# Patient Record
Sex: Female | Born: 1995 | Race: Black or African American | Hispanic: No | Marital: Married | Smoking: Former smoker
Health system: Southern US, Community
[De-identification: ages and names within clinical notes are randomized; demographics above are authoritative.]

## PROBLEM LIST (undated history)

## (undated) ENCOUNTER — Inpatient Hospital Stay (HOSPITAL_COMMUNITY): Payer: Self-pay

## (undated) DIAGNOSIS — Z3480 Encounter for supervision of other normal pregnancy, unspecified trimester: Secondary | ICD-10-CM

## (undated) DIAGNOSIS — F319 Bipolar disorder, unspecified: Secondary | ICD-10-CM

## (undated) DIAGNOSIS — Z8619 Personal history of other infectious and parasitic diseases: Secondary | ICD-10-CM

## (undated) DIAGNOSIS — B999 Unspecified infectious disease: Secondary | ICD-10-CM

## (undated) DIAGNOSIS — F419 Anxiety disorder, unspecified: Secondary | ICD-10-CM

## (undated) DIAGNOSIS — Z3483 Encounter for supervision of other normal pregnancy, third trimester: Secondary | ICD-10-CM

## (undated) DIAGNOSIS — F329 Major depressive disorder, single episode, unspecified: Secondary | ICD-10-CM

## (undated) DIAGNOSIS — N390 Urinary tract infection, site not specified: Secondary | ICD-10-CM

## (undated) DIAGNOSIS — J45909 Unspecified asthma, uncomplicated: Secondary | ICD-10-CM

## (undated) DIAGNOSIS — F32A Depression, unspecified: Secondary | ICD-10-CM

## (undated) DIAGNOSIS — F909 Attention-deficit hyperactivity disorder, unspecified type: Secondary | ICD-10-CM

## (undated) HISTORY — DX: Bipolar disorder, unspecified: F31.9

## (undated) HISTORY — PX: NO PAST SURGERIES: SHX2092

## (undated) HISTORY — DX: Personal history of other infectious and parasitic diseases: Z86.19

## (undated) HISTORY — DX: Attention-deficit hyperactivity disorder, unspecified type: F90.9

## (undated) HISTORY — PX: WISDOM TOOTH EXTRACTION: SHX21

---
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
## 2013-03-31 ENCOUNTER — Encounter (HOSPITAL_COMMUNITY): Payer: Self-pay

## 2013-03-31 ENCOUNTER — Inpatient Hospital Stay (HOSPITAL_COMMUNITY)
Admission: AD | Admit: 2013-03-31 | Discharge: 2013-03-31 | Disposition: A | Discharge status: Home / Self Care | Payer: Medicaid Other | Source: Ambulatory Visit | Attending: Obstetrics and Gynecology | Admitting: Obstetrics and Gynecology

## 2013-03-31 DIAGNOSIS — R05 Cough: Secondary | ICD-10-CM | POA: Insufficient documentation

## 2013-03-31 DIAGNOSIS — H9209 Otalgia, unspecified ear: Secondary | ICD-10-CM | POA: Insufficient documentation

## 2013-03-31 DIAGNOSIS — R059 Cough, unspecified: Secondary | ICD-10-CM | POA: Insufficient documentation

## 2013-03-31 DIAGNOSIS — O99891 Other specified diseases and conditions complicating pregnancy: Secondary | ICD-10-CM | POA: Insufficient documentation

## 2013-03-31 DIAGNOSIS — J069 Acute upper respiratory infection, unspecified: Secondary | ICD-10-CM

## 2013-03-31 DIAGNOSIS — R109 Unspecified abdominal pain: Secondary | ICD-10-CM | POA: Insufficient documentation

## 2013-03-31 DIAGNOSIS — J029 Acute pharyngitis, unspecified: Secondary | ICD-10-CM | POA: Insufficient documentation

## 2013-03-31 HISTORY — DX: Urinary tract infection, site not specified: N39.0

## 2013-03-31 HISTORY — DX: Unspecified asthma, uncomplicated: J45.909

## 2013-03-31 HISTORY — DX: Unspecified infectious disease: B99.9

## 2013-03-31 LAB — URINALYSIS, ROUTINE W REFLEX MICROSCOPIC
Bilirubin Urine: NEGATIVE
Glucose, UA: NEGATIVE mg/dL
Hgb urine dipstick: NEGATIVE
Specific Gravity, Urine: 1.02 (ref 1.005–1.030)
Urobilinogen, UA: 2 mg/dL — ABNORMAL HIGH (ref 0.0–1.0)

## 2013-03-31 LAB — URINE MICROSCOPIC-ADD ON

## 2013-03-31 NOTE — MAU Note (Signed)
Patient presents to MAU with c/o sore throat since Monday, progressing to chest congestion and productive cough. Reports green sputum since last night. Denies fever, chills.  Hx of asthma; denies inhaler use at present time.  Denies vaginal bleeding, LOF or cramping.

## 2013-03-31 NOTE — MAU Provider Note (Signed)
History     CSN: [id number]  Arrival date and time: 03/31/13 4098   First Provider Initiated Contact with Patient 03/31/13 [phone]      Chief Complaint  Patient presents with  . URI  . Cough  . Abdominal Pain   HPI Mikayla [name] is a 17 y.o. G2P1001 at [redacted]w[redacted]d who presents to MAU today with sore throat, cough, congestion and ear pain since Monday. The patient states that she has productive cough, with green sputum. She is having nasal congestion. She denies fever, N/V, abdominal pain, vaginal bleeding or discharge. She reports good fetal movement. She has not tried anything OTC for her symptoms.   OB History   Grav Para Term Preterm Abortions TAB SAB Ect Mult Living   1               Past Medical History  Diagnosis Date  . Infection   . UTI (lower urinary tract infection)     No past surgical history on file.  No family history on file.  History  Substance Use Topics  . Smoking status: Not on file  . Smokeless tobacco: Not on file  . Alcohol Use: Not on file    Allergies:  Allergies  Allergen Reactions  . Codeine Swelling  . Penicillins Swelling    Prescriptions prior to admission  Medication Sig Dispense Refill  . diphenhydrAMINE (BENADRYL) 25 MG tablet Take 50 mg by mouth every 6 (six) hours as needed for itching or allergies.      . Prenatal Vit-Fe Fumarate-FA (PRENATAL MULTIVITAMIN) TABS Take 1 tablet by mouth daily at 12 noon.        Review of Systems  Constitutional: Negative for fever, chills and malaise/fatigue.  HENT: Positive for ear pain, congestion and sore throat.   Respiratory: Positive for cough and sputum production.   Gastrointestinal: Positive for nausea and vomiting. Negative for abdominal pain, diarrhea and constipation.  Genitourinary: Negative for dysuria, urgency and frequency.       Neg - vaginal bleeding, discharge, LOF   Physical Exam   Blood pressure 117/69, pulse 92, temperature 97.9 F (36.6 C), temperature source Oral,  height 5\' 5"  (1.651 m), weight 147 lb 12.8 oz (67.042 kg), last menstrual period 11/16/2012, SpO2 100.00%.  Physical Exam  Constitutional: She is oriented to person, place, and time. She appears well-developed and well-nourished. No distress.  HENT:  Head: Normocephalic and atraumatic.  Right Ear: No drainage or swelling. Tympanic membrane is not erythematous and not bulging.  Left Ear: No drainage or swelling. Tympanic membrane is not erythematous and not bulging.  Nose: Mucosal edema and rhinorrhea present. No sinus tenderness. No epistaxis. Right sinus exhibits no maxillary sinus tenderness and no frontal sinus tenderness. Left sinus exhibits no maxillary sinus tenderness and no frontal sinus tenderness.  Mouth/Throat: Posterior oropharyngeal erythema present. No oropharyngeal exudate, posterior oropharyngeal edema or tonsillar abscesses.  Cardiovascular: Normal rate, regular rhythm and normal heart sounds.   Respiratory: Effort normal and breath sounds normal. No respiratory distress. She has no wheezes.  GI: Soft. She exhibits no distension and no mass. There is no tenderness. There is no rebound and no guarding.  Neurological: She is alert and oriented to person, place, and time.  Skin: Skin is warm and dry. No erythema.  Psychiatric: She has a normal mood and affect.    MAU Course  Procedures None  MDM Discussed patient with Dr. Senaida Ores. Patient should take approved OTC cold medications and call the office  for follow-up if symptoms don't improve in ~1 week or she develops fever  Assessment and Plan  A: Acute viral URI  P: Discharge home Patient encouraged to take OTC cold medications approved in pregnancy. Handout given Patient should follow-up in office as scheduled or sooner if she develops fever or symptoms fail to improve Patient may return to MAU as needed  Freddi Starr, PA-C  03/31/2013, 9:44 AM

## 2013-03-31 NOTE — MAU Note (Signed)
Patient states that at the beginning of this week she began having cold like symptoms, sore throat, cough and ear pain. Has gotten worse over the week and had chest tightness and coughed up green mucus. Has a little abdominal cramping, no bleeding or discharge.

## 2013-04-11 LAB — OB RESULTS CONSOLE ABO/RH: RH Type: POSITIVE

## 2013-04-11 LAB — OB RESULTS CONSOLE ANTIBODY SCREEN: Antibody Screen: NEGATIVE

## 2013-04-11 LAB — OB RESULTS CONSOLE GC/CHLAMYDIA: Chlamydia: NEGATIVE

## 2013-04-11 LAB — OB RESULTS CONSOLE HEPATITIS B SURFACE ANTIGEN: Hepatitis B Surface Ag: NEGATIVE

## 2013-06-30 ENCOUNTER — Encounter (HOSPITAL_COMMUNITY): Payer: Self-pay | Admitting: *Deleted

## 2013-06-30 ENCOUNTER — Inpatient Hospital Stay (HOSPITAL_COMMUNITY)
Admission: AD | Admit: 2013-06-30 | Discharge: 2013-07-01 | Disposition: A | Discharge status: Home / Self Care | Payer: Medicaid Other | Source: Ambulatory Visit | Attending: Obstetrics and Gynecology | Admitting: Obstetrics and Gynecology

## 2013-06-30 DIAGNOSIS — M545 Low back pain, unspecified: Secondary | ICD-10-CM | POA: Insufficient documentation

## 2013-06-30 DIAGNOSIS — O4703 False labor before 37 completed weeks of gestation, third trimester: Secondary | ICD-10-CM

## 2013-06-30 DIAGNOSIS — O4700 False labor before 37 completed weeks of gestation, unspecified trimester: Secondary | ICD-10-CM | POA: Insufficient documentation

## 2013-06-30 LAB — URINALYSIS, ROUTINE W REFLEX MICROSCOPIC
Glucose, UA: NEGATIVE mg/dL
Ketones, ur: NEGATIVE mg/dL
Nitrite: NEGATIVE
Protein, ur: NEGATIVE mg/dL
Urobilinogen, UA: 0.2 mg/dL (ref 0.0–1.0)

## 2013-06-30 LAB — URINE MICROSCOPIC-ADD ON

## 2013-06-30 NOTE — MAU Note (Signed)
I've been having contractions for over an hour. Having pain in lower back and butt. No bleeding. Leaking alittle watery fld and white d/c

## 2013-06-30 NOTE — MAU Note (Addendum)
PT SAYS SHE STARTED HURTING AT 6PM-  HAVING UC'S.  -  WHILE SHOPPING AT [facility].    NO VE IN OFFICE.  LAST SEX-   AT 5PM.   PT HAS BEEN DX  WITH BIPOLAR, DEPRESSION, ANXIETY, ADHD-  NO MEDS AND NO THERAPY

## 2013-06-30 NOTE — MAU Provider Note (Signed)
Chief Complaint:  Contractions   First Provider Initiated Contact with Patient 06/30/13 2349      HPI: Mikayla [name] is a 17 y.o. G2P1001 at [redacted]w[redacted]d who presents to maternity admissions reporting contractions and back pain with onset today after intercourse.  She reports good fetal movement, denies LOF, vaginal bleeding, vaginal itching/burning, urinary symptoms, h/a, dizziness, n/v, or fever/chills.    Past Medical History: Past Medical History  Diagnosis Date  . Infection   . UTI (lower urinary tract infection)   . Asthma     Past obstetric history: OB History  Gravida Para Term Preterm AB SAB TAB Ectopic Multiple Living  2 1 1       1     # Outcome Date GA Lbr Len/2nd Weight Sex Delivery Anes PTL Lv  2 CUR           1 TRM  [redacted]w[redacted]d   F SVD EPI N Y      Past Surgical History: Past Surgical History  Procedure Laterality Date  . No past surgeries      Family History: History reviewed. No pertinent family history.  Social History: History  Substance Use Topics  . Smoking status: Former Smoker    Quit date: 10/31/2012  . Smokeless tobacco: Never Used  . Alcohol Use: No    Allergies:  Allergies  Allergen Reactions  . Codeine Swelling  . Penicillins Swelling    Meds:  Prescriptions prior to admission  Medication Sig Dispense Refill  . diphenhydrAMINE (BENADRYL) 25 MG tablet Take 50 mg by mouth every 6 (six) hours as needed for itching or allergies.      . Prenatal Vit-Fe Fumarate-FA (PRENATAL MULTIVITAMIN) TABS Take 1 tablet by mouth daily at 12 noon.        ROS: Pertinent findings in history of present illness.  Physical Exam  Blood pressure 135/71, pulse 88, temperature 98.1 F (36.7 C), resp. rate 18, height 5\' 4"  (1.626 m), weight 78.382 kg (172 lb 12.8 oz), last menstrual period 11/16/2012. GENERAL: Well-developed, well-nourished female in no acute distress.  HEENT: normocephalic HEART: normal rate RESP: normal effort ABDOMEN: Soft, non-tender,  gravid appropriate for gestational age EXTREMITIES: Nontender, no edema NEURO: alert and oriented   Dilation: Closed Effacement (%): 30 Cervical Position: Posterior Exam by:: Arman Loy, CNM External os 2 cm, cervix firm, posterior  FHT:  Baseline 140, moderate variability, accelerations present, no decelerations Contractions: occasional, lasting ~40 seconds   Labs: Results for orders placed during the hospital encounter of 06/30/13 (from the past 24 hour(s))  URINALYSIS, ROUTINE W REFLEX MICROSCOPIC     Status: Abnormal   Collection Time    06/30/13 11:05 PM      Result Value Range   Color, Urine YELLOW  YELLOW   APPearance CLEAR  CLEAR   Specific Gravity, Urine >1.030 (*) 1.005 - 1.030   pH 6.5  5.0 - 8.0   Glucose, UA NEGATIVE  NEGATIVE mg/dL   Hgb urine dipstick NEGATIVE  NEGATIVE   Bilirubin Urine NEGATIVE  NEGATIVE   Ketones, ur NEGATIVE  NEGATIVE mg/dL   Protein, ur NEGATIVE  NEGATIVE mg/dL   Urobilinogen, UA 0.2  0.0 - 1.0 mg/dL   Nitrite NEGATIVE  NEGATIVE   Leukocytes, UA SMALL (*) NEGATIVE  URINE MICROSCOPIC-ADD ON     Status: Abnormal   Collection Time    06/30/13 11:05 PM      Result Value Range   Squamous Epithelial / LPF FEW (*) RARE   WBC, UA  3-6  <3 WBC/hpf   RBC / HPF 0-2  <3 RBC/hpf   Bacteria, UA FEW (*) RARE    Assessment: 1. Preterm contractions, third trimester     Plan: Consult with Dr Ellyn Hack Discharge home PTL precautions and fetal kick counts Increase PO fluid F/U in office Return to MAU as needed    Medication List    ASK your doctor about these medications       diphenhydrAMINE 25 MG tablet  Commonly known as:  BENADRYL  Take 50 mg by mouth every 6 (six) hours as needed for itching or allergies.     prenatal multivitamin Tabs tablet  Take 1 tablet by mouth daily at 12 noon.        Sharen Counter Certified Nurse-Midwife 06/30/2013 11:56 PM

## 2013-07-01 DIAGNOSIS — O479 False labor, unspecified: Secondary | ICD-10-CM

## 2013-07-10 LAB — URINE CULTURE: Culture: NO GROWTH

## 2013-07-20 LAB — OB RESULTS CONSOLE GBS: GBS: NEGATIVE

## 2013-08-18 ENCOUNTER — Encounter (HOSPITAL_COMMUNITY): Payer: Self-pay

## 2013-08-18 DIAGNOSIS — Z3480 Encounter for supervision of other normal pregnancy, unspecified trimester: Secondary | ICD-10-CM

## 2013-08-18 HISTORY — DX: Encounter for supervision of other normal pregnancy, unspecified trimester: Z34.80

## 2013-08-18 NOTE — H&P (Addendum)
Mikayla [name] is a 17 y.o. female G2P1001 at 39+ for IOL, term, favorable cervix.  Dated by LMP cw [place] - late entry to care 18 wk.  +FM, no LOF, no VB, occ ctx.  Relatively uncomplicated PNC except ?scoliosis, ADHD, bipolar/depression - well-controlled.  Also h/o asthma and seasonal allergies.    Maternal Medical History:  Contractions: Frequency: irregular.   Perceived severity is moderate.    Fetal activity: Perceived fetal activity is normal.    Prenatal complications: no prenatal complications Prenatal Complications - Diabetes: none.    OB History   Grav Para Term Preterm Abortions TAB SAB Ect Mult Living   2 1 1       1     G1 09/20/10 TSVD 7#15 female G2 present No pap, no STDs Past Medical History  Diagnosis Date  . Infection   . UTI (lower urinary tract infection)   . Asthma   . Normal pregnancy, repeat 08/18/2013   Past Surgical History  Procedure Laterality Date  . No past surgeries     Family History: family history is not on file. Social History:  reports that she quit smoking about 9 months ago. She has never used smokeless tobacco. She reports that she does not drink alcohol or use illicit drugs. Meds PNV All: Codeine, PCN   Prenatal Transfer Tool  Maternal Diabetes: No Genetic Screening: Normal Maternal Ultrasounds/Referrals: Normal Fetal Ultrasounds or other Referrals:  None Maternal Substance Abuse:  No Significant Maternal Medications:  None Significant Maternal Lab Results:  Lab values include: Group B Strep negative Other Comments:  Mom has dx of bipolar/depression - well controlled, no meds  Review of Systems  Constitutional: Negative.   HENT: Negative.   Eyes: Negative.   Respiratory: Negative.   Cardiovascular: Negative.   Gastrointestinal: Negative.   Genitourinary: Negative.   Musculoskeletal: Negative.   Skin: Negative.   Neurological: Negative.   Psychiatric/Behavioral: Negative.       Last menstrual period  11/16/2012. Maternal Exam:  Uterine Assessment: Contraction strength is mild.  Contraction frequency is irregular.   Abdomen: Fundal height is appropriate for gestation.   Estimated fetal weight is 7-8#.   Fetal presentation: vertex  Introitus: Normal vulva. Normal vagina.  Pelvis: adequate for delivery.   Cervix: Cervix evaluated by digital exam.     Physical Exam  Constitutional: She is oriented to person, place, and time. She appears well-developed and well-nourished.  HENT:  Head: Normocephalic and atraumatic.  Cardiovascular: Normal rate and regular rhythm.   Respiratory: Effort normal and breath sounds normal. No respiratory distress. She has no wheezes.  GI: Soft. Bowel sounds are normal. She exhibits no distension. There is no tenderness.  Musculoskeletal: Normal range of motion.  Neurological: She is alert and oriented to person, place, and time.  Skin: Skin is warm and dry.  Psychiatric: She has a normal mood and affect. Her behavior is normal.    Prenatal labs: ABO, Rh:  A+ Antibody:  neg Rubella:  immune RPR:   NR HBsAg:   neg HIV:   neg GBS:   neg  Hgb 11.2/Plt 243K/ UrCx neg/ hgb electro WNL/ CF neg/ AFP WNL/ glucola 73  Tdap 05/23/13 Flu 07/20/13  [place] EDC 08/23/13, nl anat, post plac, female  Assessment/Plan: 17yo G2P1001 at 39+ for IOL gbbs negative IOL with AROM/ pitocin Expect SVD   [name],Jameis Newsham 08/18/2013, 9:05 PM

## 2013-08-19 ENCOUNTER — Inpatient Hospital Stay (HOSPITAL_COMMUNITY)
Admission: RE | Admit: 2013-08-19 | Discharge: 2013-08-21 | DRG: 775 | Disposition: A | Discharge status: Home / Self Care | Payer: Medicaid Other | Source: Ambulatory Visit | Attending: Obstetrics and Gynecology | Admitting: Obstetrics and Gynecology

## 2013-08-19 ENCOUNTER — Encounter (HOSPITAL_COMMUNITY): Payer: Self-pay

## 2013-08-19 ENCOUNTER — Encounter (HOSPITAL_COMMUNITY): Payer: Medicaid Other | Admitting: Anesthesiology

## 2013-08-19 ENCOUNTER — Inpatient Hospital Stay (HOSPITAL_COMMUNITY): Payer: Medicaid Other | Admitting: Anesthesiology

## 2013-08-19 VITALS — BP 104/51 | HR 70 | Temp 98.5°F | Resp 18 | Ht 64.0 in | Wt 177.4 lb

## 2013-08-19 DIAGNOSIS — Z885 Allergy status to narcotic agent status: Secondary | ICD-10-CM

## 2013-08-19 DIAGNOSIS — F909 Attention-deficit hyperactivity disorder, unspecified type: Secondary | ICD-10-CM | POA: Diagnosis present

## 2013-08-19 DIAGNOSIS — O99344 Other mental disorders complicating childbirth: Secondary | ICD-10-CM | POA: Diagnosis present

## 2013-08-19 DIAGNOSIS — Z3480 Encounter for supervision of other normal pregnancy, unspecified trimester: Secondary | ICD-10-CM

## 2013-08-19 DIAGNOSIS — F319 Bipolar disorder, unspecified: Secondary | ICD-10-CM | POA: Diagnosis present

## 2013-08-19 DIAGNOSIS — M4120 Other idiopathic scoliosis, site unspecified: Secondary | ICD-10-CM | POA: Diagnosis present

## 2013-08-19 DIAGNOSIS — Z3483 Encounter for supervision of other normal pregnancy, third trimester: Secondary | ICD-10-CM

## 2013-08-19 DIAGNOSIS — O99892 Other specified diseases and conditions complicating childbirth: Secondary | ICD-10-CM | POA: Diagnosis present

## 2013-08-19 DIAGNOSIS — O99891 Other specified diseases and conditions complicating pregnancy: Secondary | ICD-10-CM | POA: Diagnosis present

## 2013-08-19 HISTORY — DX: Encounter for supervision of other normal pregnancy, unspecified trimester: Z34.80

## 2013-08-19 LAB — CBC
HCT: 31.9 % — ABNORMAL LOW (ref 36.0–46.0)
MCH: 25.4 pg — ABNORMAL LOW (ref 26.0–34.0)
MCHC: 32.6 g/dL (ref 30.0–36.0)
MCV: 78 fL (ref 78.0–100.0)
Platelets: 264 10*3/uL (ref 150–400)
RDW: 13.2 % (ref 11.5–15.5)
WBC: 10 10*3/uL (ref 4.0–10.5)

## 2013-08-19 LAB — RPR: RPR Ser Ql: NONREACTIVE

## 2013-08-19 MED ORDER — PHENYLEPHRINE 40 MCG/ML (10ML) SYRINGE FOR IV PUSH (FOR BLOOD PRESSURE SUPPORT)
80.0000 ug | PREFILLED_SYRINGE | INTRAVENOUS | Status: DC | PRN
  Filled 2013-08-19: qty 2

## 2013-08-19 MED ORDER — DIBUCAINE 1 % RE OINT: 1.0000 "application " | TOPICAL_OINTMENT | RECTAL | Status: DC | PRN

## 2013-08-19 MED ORDER — LIDOCAINE HCL (PF) 1 % IJ SOLN
30.0000 mL | INTRAMUSCULAR | Status: DC | PRN
  Filled 2013-08-19: qty 30

## 2013-08-19 MED ORDER — PHENYLEPHRINE 40 MCG/ML (10ML) SYRINGE FOR IV PUSH (FOR BLOOD PRESSURE SUPPORT)
80.0000 ug | PREFILLED_SYRINGE | INTRAVENOUS | Status: DC | PRN
  Filled 2013-08-19: qty 5
  Filled 2013-08-19: qty 2

## 2013-08-19 MED ORDER — IBUPROFEN 600 MG PO TABS
600.0000 mg | ORAL_TABLET | Freq: Four times a day (QID) | ORAL | Status: DC | PRN
  Administered 2013-08-19: 600 mg via ORAL
  Filled 2013-08-19: qty 1

## 2013-08-19 MED ORDER — LIDOCAINE HCL (PF) 1 % IJ SOLN
INTRAMUSCULAR | Status: DC | PRN
  Administered 2013-08-19 (×2): 9 mL

## 2013-08-19 MED ORDER — EPHEDRINE 5 MG/ML INJ
10.0000 mg | INTRAVENOUS | Status: DC | PRN
  Filled 2013-08-19: qty 2
  Filled 2013-08-19: qty 4

## 2013-08-19 MED ORDER — SENNOSIDES-DOCUSATE SODIUM 8.6-50 MG PO TABS
2.0000 | ORAL_TABLET | ORAL | Status: DC
  Administered 2013-08-20 (×2): 2 via ORAL
  Filled 2013-08-19 (×2): qty 2

## 2013-08-19 MED ORDER — DIPHENHYDRAMINE HCL 25 MG PO CAPS: 25.0000 mg | ORAL_CAPSULE | Freq: Four times a day (QID) | ORAL | Status: DC | PRN

## 2013-08-19 MED ORDER — ZOLPIDEM TARTRATE 5 MG PO TABS: 5.0000 mg | ORAL_TABLET | Freq: Every evening | ORAL | Status: DC | PRN

## 2013-08-19 MED ORDER — IBUPROFEN 600 MG PO TABS
600.0000 mg | ORAL_TABLET | Freq: Four times a day (QID) | ORAL | Status: DC
  Administered 2013-08-20 – 2013-08-21 (×5): 600 mg via ORAL
  Filled 2013-08-19 (×7): qty 1

## 2013-08-19 MED ORDER — BENZOCAINE-MENTHOL 20-0.5 % EX AERO: 1.0000 "application " | INHALATION_SPRAY | CUTANEOUS | Status: DC | PRN

## 2013-08-19 MED ORDER — FENTANYL 2.5 MCG/ML BUPIVACAINE 1/10 % EPIDURAL INFUSION (WH - ANES)
INTRAMUSCULAR | Status: DC | PRN
  Administered 2013-08-19: 14 mL/h via EPIDURAL

## 2013-08-19 MED ORDER — OXYTOCIN 40 UNITS IN LACTATED RINGERS INFUSION - SIMPLE MED
1.0000 m[IU]/min | INTRAVENOUS | Status: DC
  Administered 2013-08-19: 2 m[IU]/min via INTRAVENOUS
  Filled 2013-08-19: qty 1000

## 2013-08-19 MED ORDER — WITCH HAZEL-GLYCERIN EX PADS: 1.0000 "application " | MEDICATED_PAD | CUTANEOUS | Status: DC | PRN

## 2013-08-19 MED ORDER — LACTATED RINGERS IV SOLN
INTRAVENOUS | Status: DC
  Administered 2013-08-19: 1000 mL via INTRAVENOUS
  Administered 2013-08-19: 08:00:00 via INTRAVENOUS

## 2013-08-19 MED ORDER — EPHEDRINE 5 MG/ML INJ
10.0000 mg | INTRAVENOUS | Status: DC | PRN
  Filled 2013-08-19: qty 2

## 2013-08-19 MED ORDER — OXYTOCIN BOLUS FROM INFUSION: 500.0000 mL | INTRAVENOUS | Status: DC

## 2013-08-19 MED ORDER — LANOLIN HYDROUS EX OINT: TOPICAL_OINTMENT | CUTANEOUS | Status: DC | PRN

## 2013-08-19 MED ORDER — LACTATED RINGERS IV SOLN: 500.0000 mL | INTRAVENOUS | Status: DC | PRN

## 2013-08-19 MED ORDER — ONDANSETRON HCL 4 MG/2ML IJ SOLN: 4.0000 mg | Freq: Four times a day (QID) | INTRAMUSCULAR | Status: DC | PRN

## 2013-08-19 MED ORDER — PRENATAL MULTIVITAMIN CH
1.0000 | ORAL_TABLET | Freq: Every day | ORAL | Status: DC
  Administered 2013-08-20: 1 via ORAL
  Filled 2013-08-19: qty 1

## 2013-08-19 MED ORDER — PNEUMOCOCCAL VAC POLYVALENT 25 MCG/0.5ML IJ INJ
0.5000 mL | INJECTION | INTRAMUSCULAR | Status: AC
  Administered 2013-08-21: 0.5 mL via INTRAMUSCULAR
  Filled 2013-08-19 (×2): qty 0.5

## 2013-08-19 MED ORDER — LACTATED RINGERS IV SOLN: INTRAVENOUS | Status: DC

## 2013-08-19 MED ORDER — OXYCODONE-ACETAMINOPHEN 5-325 MG PO TABS: 1.0000 | ORAL_TABLET | ORAL | Status: DC | PRN

## 2013-08-19 MED ORDER — CITRIC ACID-SODIUM CITRATE 334-500 MG/5ML PO SOLN: 30.0000 mL | ORAL | Status: DC | PRN

## 2013-08-19 MED ORDER — ONDANSETRON HCL 4 MG PO TABS: 4.0000 mg | ORAL_TABLET | ORAL | Status: DC | PRN

## 2013-08-19 MED ORDER — OXYTOCIN 40 UNITS IN LACTATED RINGERS INFUSION - SIMPLE MED: 62.5000 mL/h | INTRAVENOUS | Status: DC

## 2013-08-19 MED ORDER — FENTANYL 2.5 MCG/ML BUPIVACAINE 1/10 % EPIDURAL INFUSION (WH - ANES)
14.0000 mL/h | INTRAMUSCULAR | Status: DC | PRN
  Administered 2013-08-19: 14 mL/h via EPIDURAL
  Filled 2013-08-19 (×2): qty 125

## 2013-08-19 MED ORDER — TRAMADOL HCL 50 MG PO TABS
50.0000 mg | ORAL_TABLET | Freq: Four times a day (QID) | ORAL | Status: DC | PRN
  Administered 2013-08-20: 50 mg via ORAL
  Filled 2013-08-19: qty 1

## 2013-08-19 MED ORDER — ONDANSETRON HCL 4 MG/2ML IJ SOLN: 4.0000 mg | INTRAMUSCULAR | Status: DC | PRN

## 2013-08-19 MED ORDER — DIPHENHYDRAMINE HCL 50 MG/ML IJ SOLN: 12.5000 mg | INTRAMUSCULAR | Status: DC | PRN

## 2013-08-19 MED ORDER — SIMETHICONE 80 MG PO CHEW: 80.0000 mg | CHEWABLE_TABLET | ORAL | Status: DC | PRN

## 2013-08-19 MED ORDER — ACETAMINOPHEN 325 MG PO TABS: 650.0000 mg | ORAL_TABLET | ORAL | Status: DC | PRN

## 2013-08-19 MED ORDER — TERBUTALINE SULFATE 1 MG/ML IJ SOLN: 0.2500 mg | Freq: Once | INTRAMUSCULAR | Status: DC | PRN

## 2013-08-19 MED ORDER — LACTATED RINGERS IV SOLN
500.0000 mL | Freq: Once | INTRAVENOUS | Status: AC
  Administered 2013-08-19: 500 mL via INTRAVENOUS

## 2013-08-19 NOTE — Anesthesia Procedure Notes (Signed)
Epidural Patient location during procedure: OB Start time: 08/19/2013 9:46 AM End time: 08/19/2013 9:50 AM  Staffing Anesthesiologist: Leilani Able Performed by: anesthesiologist   Preanesthetic Checklist Completed: patient identified, surgical consent, pre-op evaluation, timeout performed, IV checked, risks and benefits discussed and monitors and equipment checked  Epidural Patient position: sitting Prep: site prepped and draped and DuraPrep Patient monitoring: continuous pulse ox and blood pressure Approach: midline Injection technique: LOR air  Needle:  Needle type: Tuohy  Needle gauge: 17 G Needle length: 9 cm and 9 Needle insertion depth: 5 cm cm Catheter type: closed end flexible Catheter size: 19 Gauge Catheter at skin depth: 10 cm Test dose: negative and Other  Assessment Sensory level: T9 Events: blood not aspirated, injection not painful, no injection resistance, negative IV test and no paresthesia  Additional Notes Reason for block:procedure for pain

## 2013-08-19 NOTE — Anesthesia Preprocedure Evaluation (Signed)
Anesthesia Evaluation  Patient identified by MRN, date of birth, ID band Patient awake    Reviewed: Allergy & Precautions, H&P , NPO status , Patient's Chart, lab work & pertinent test results  Airway Mallampati: II TM Distance: >3 FB Neck ROM: full    Dental no notable dental hx.    Pulmonary neg pulmonary ROS,    Pulmonary exam normal       Cardiovascular negative cardio ROS      Neuro/Psych negative neurological ROS  negative psych ROS   GI/Hepatic negative GI ROS, Neg liver ROS,   Endo/Other  negative endocrine ROS  Renal/GU negative Renal ROS  negative genitourinary   Musculoskeletal negative musculoskeletal ROS (+)   Abdominal Normal abdominal exam  (+)   Peds  Hematology negative hematology ROS (+)   Anesthesia Other Findings   Reproductive/Obstetrics (+) Pregnancy                           Anesthesia Physical Anesthesia Plan  ASA: II  Anesthesia Plan: Epidural   Post-op Pain Management:    Induction:   Airway Management Planned:   Additional Equipment:   Intra-op Plan:   Post-operative Plan:   Informed Consent: I have reviewed the patients History and Physical, chart, labs and discussed the procedure including the risks, benefits and alternatives for the proposed anesthesia with the patient or authorized representative who has indicated his/her understanding and acceptance.     Plan Discussed with:   Anesthesia Plan Comments:         Anesthesia Quick Evaluation  

## 2013-08-19 NOTE — Progress Notes (Signed)
Patient ID: Mikayla [name], female   DOB: Feb 16, 1996, 17 y.o.   MRN: [id number]  H&P reviewed, no changes  AFVSS gen NAD  FHTs 140's, good var toco Q 2-29min  SVE 4.5/30/-2,  AROM clear fluid Fingers noted on head  Start pitocin expect SVD

## 2013-08-19 NOTE — Progress Notes (Signed)
Dr Ellyn Hack notified of patient's allergy to codeine.  Percocet order discontinued and ultram ordered per Dr Ellyn Hack

## 2013-08-19 NOTE — Progress Notes (Signed)
Patient ID: Mikayla [name], female   DOB: 27-Oct-1995, 18 y.o.   MRN: [id number]  Comfortable with epidural  AFVSS gen NAD FHTs120's category 1, good variability toco Q 2-4 min  SVE 5.6/50/-1  AROM of forebag for clear fluid  Expect SVD Cont close monitoring

## 2013-08-20 LAB — CBC
HCT: 31.8 % — ABNORMAL LOW (ref 36.0–46.0)
Hemoglobin: 10.1 g/dL — ABNORMAL LOW (ref 12.0–15.0)
MCH: 24.9 pg — ABNORMAL LOW (ref 26.0–34.0)
MCHC: 31.8 g/dL (ref 30.0–36.0)
MCV: 78.5 fL (ref 78.0–100.0)
WBC: 14.8 10*3/uL — ABNORMAL HIGH (ref 4.0–10.5)

## 2013-08-20 MED ORDER — PRENATAL MULTIVITAMIN CH: 1.0000 | ORAL_TABLET | Freq: Every day | ORAL | Status: DC

## 2013-08-20 MED ORDER — IBUPROFEN 800 MG PO TABS: 800.0000 mg | ORAL_TABLET | Freq: Three times a day (TID) | ORAL | Status: DC | PRN

## 2013-08-20 MED ORDER — TRAMADOL HCL 50 MG PO TABS: 50.0000 mg | ORAL_TABLET | Freq: Four times a day (QID) | ORAL | Status: DC | PRN

## 2013-08-20 NOTE — Progress Notes (Signed)
Clinical Social Work Department PSYCHOSOCIAL ASSESSMENT - MATERNAL/CHILD 08/20/2013  Patient:  Mikayla [name]  Account Number:  [id number]  Admit Date:  08/19/2013  Childs Name:   Mikayla [name]    Clinical Social Worker:  Mikayla Menna, LCSW   Date/Time:  08/20/2013 01:00 PM  Date Referred:  08/19/2013   Referral source  Central Nursery     Referred reason  Depression/Anxiety   Other referral source:    I:  FAMILY / HOME ENVIRONMENT Child's legal guardian:  PARENT  Guardian - Name Guardian - Age Guardian - Address  [name], Mikayla [name] 17 [address]  [place], [place] [address]  Mikayla [name] 18 same as above   Other household support members/support persons Other support:   Paternal relatives    II  PSYCHOSOCIAL DATA Information Source:  Patient Interview  Financial and Community Resources Employment:   FOB is employed   Financial resources:  Medicaid If Medicaid - County:   Other  WIC  Food Stamps    III  STRENGTHS Strengths  Adequate Resources  Home prepared for Child (including basic supplies)  Supportive family/friends   Strength comment:    IV  RISK FACTORS AND CURRENT PROBLEMS Current Problem:     Risk Factor & Current Problem Patient Issue Family Issue Risk Factor / Current Problem Comment   N N     V  SOCIAL WORK ASSESSMENT Met with mother who was pleasant and receptive to social work intervention.  She is a single parent with one other dependent age 2 who lives in [place] with maternal grandmother.  FOB along with several of his relatives were present and very attentive to the baby.    Mother reports that she completed 8th grade and dropped out because of her home situation.  She communicate a desire to get her high school diploma.  Encouraged her to get her high school diploma or GED.   She reports treatment hx of bipolar, depression and anxiety.  Mother states that while living with her mother, she was hospitalized 6 times in a psychiatric  facility with most recent hospitalization last year.  Informed that she was taking medications but stopped taking them because of the pregnancy.  Mother notes that she has maintained stability since she moved out of her mother's home and have been able to function fine without medication.  She attributes most of her mental instability to the stressful unhealthy environment she was living in with her mother.  She denied any depressive symptoms or anxiety during pregnancy as well as current symptoms.  Mother states that she plans to follow up with counseling and try to avoid having to take medication for the depression and anxiety.   She denies any hx of substance abuse.  Spoke with parents at length about some of the challenges of parenting.  They were receptive.      Discussed family planning, and encouraged mother to speak with her physician regarding family planning to prevent future unplanned pregnancies.    They were receptive.   Parents informed of social work availability.     VI SOCIAL WORK PLAN Social Work Plan  No Further Intervention Required / No Barriers to Discharge   Type of pt/family education:   If child protective services report - county:   If child protective services report - date:   Information/referral to community resources comment:   Pediatrician:  [facility]   Wyat Infinger J, LCSW   

## 2013-08-20 NOTE — Progress Notes (Addendum)
Post Partum Day 1 Subjective: no complaints, up ad lib, voiding, tolerating PO and nl lochia, pain controlled  Objective: Blood pressure 101/67, pulse 76, temperature 98.6 F (37 C), temperature source Oral, resp. rate 20, height 5\' 4"  (1.626 m), weight 80.468 kg (177 lb 6.4 oz), last menstrual period 11/16/2012, SpO2 99.00%, unknown if currently breastfeeding.  Physical Exam:  General: alert and no distress Lochia: appropriate Uterine Fundus: firm   Recent Labs  08/19/13 0755 08/20/13 0550  HGB 10.4* 10.1*  HCT 31.9* 31.8*    Assessment/Plan: Plan for discharge tomorrow, Breastfeeding and Lactation consult.  Routine PP care. Pt desires d/c home.  Will d/c with motrin/percocet/ pnv.  F/u 6 weeks   LOS: 1 day   [name],Kallum Jorgensen 08/20/2013, 8:18 AM

## 2013-08-20 NOTE — Discharge Summary (Addendum)
Obstetric Discharge Summary Reason for Admission: induction of labor Prenatal Procedures: none Intrapartum Procedures: spontaneous vaginal delivery Postpartum Procedures: none Complications-Operative and Postpartum: 1st degree perineal laceration and vaginal laceration Hemoglobin  Date Value Range Status  08/20/2013 10.1* 12.0 - 15.0 g/dL Final     HCT  Date Value Range Status  08/20/2013 31.8* 36.0 - 46.0 % Final    Physical Exam:  General: alert and no distress Lochia: appropriate Uterine Fundus: firm  Discharge Diagnoses: Term Pregnancy-delivered  Discharge Information: Date: 08/20/2013 Activity: pelvic rest Diet: routine Medications: PNV, Ibuprofen and Ultram Condition: stable Instructions: refer to practice specific booklet Discharge to: home   Newborn Data: Live born female  Birth Weight: 6 lb 11 oz (3033 g) APGAR: 9, 9  Home with mother.  Mikayla [name] Mikayla [name] 08/20/2013, 9:51 AM  D/C held 10/26 due to infant not feeding well.  D/c with rotuin einstructions 08/21/13.

## 2013-08-21 NOTE — Anesthesia Postprocedure Evaluation (Signed)
  Anesthesia Post-op Note  Patient: Mikayla [name]  Procedure(s) Performed: * No procedures listed *  Patient Location: PACU and Mother/Baby  Anesthesia Type:Epidural  Level of Consciousness: awake, alert  and oriented  Airway and Oxygen Therapy: Patient Spontanous Breathing  Post-op Pain: none  Post-op Assessment: Post-op Vital signs reviewed, Patient's Cardiovascular Status Stable, No headache, No backache, No residual numbness and No residual motor weakness  Post-op Vital Signs: Reviewed and stable  Complications: No apparent anesthesia complications

## 2013-08-21 NOTE — Progress Notes (Addendum)
Post Partum Day 2 Subjective: no complaints, up ad lib, voiding, tolerating PO and nl lochia, pain controlled.  Didn't go home uesterday, baby not feeding well.    Objective: Blood pressure 104/51, pulse 70, temperature 98.5 F (36.9 C), temperature source Oral, resp. rate 18, height 5\' 4"  (1.626 m), weight 80.468 kg (177 lb 6.4 oz), last menstrual period 11/16/2012, SpO2 99.00%, unknown if currently breastfeeding.  Physical Exam:  General: alert and no distress Lochia: appropriate Uterine Fundus: firm   Recent Labs  08/19/13 0755 08/20/13 0550  HGB 10.4* 10.1*  HCT 31.9* 31.8*    Assessment/Plan: Discharge home, Breastfeeding and Lactation consult.  D/c with motrin, ultram, pnv.  F/u 6 weeks   LOS: 2 days   [name],Jeffory Snelgrove 08/21/2013, 7:07 AM

## 2013-08-21 NOTE — Progress Notes (Signed)
UR chart review completed.  

## 2014-01-03 ENCOUNTER — Other Ambulatory Visit (HOSPITAL_COMMUNITY): Payer: Self-pay | Admitting: Internal Medicine

## 2014-01-03 ENCOUNTER — Ambulatory Visit (HOSPITAL_COMMUNITY)
Admission: RE | Admit: 2014-01-03 | Discharge: 2014-01-03 | Disposition: A | Discharge status: Home / Self Care | Payer: Medicaid Other | Source: Ambulatory Visit | Attending: Internal Medicine | Admitting: Internal Medicine

## 2014-01-03 DIAGNOSIS — M545 Low back pain, unspecified: Secondary | ICD-10-CM | POA: Insufficient documentation

## 2014-01-03 DIAGNOSIS — R52 Pain, unspecified: Secondary | ICD-10-CM

## 2014-01-03 IMAGING — CR DG LUMBAR SPINE COMPLETE 4+V
5 series · 5 of 5 positions shown · non-contrast
Comparison: none

[t l-spine a.p.]
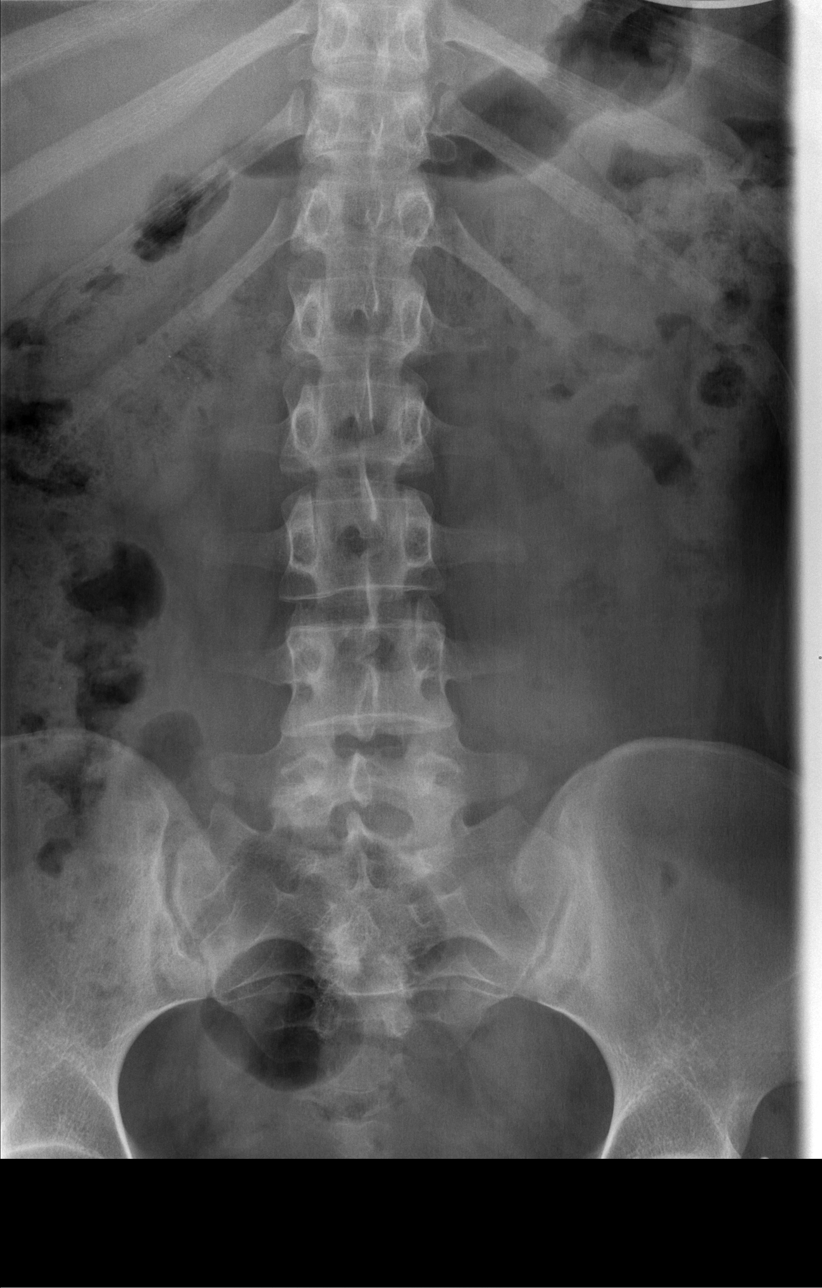

[t l-spine oblique exposure (1 of 2)]
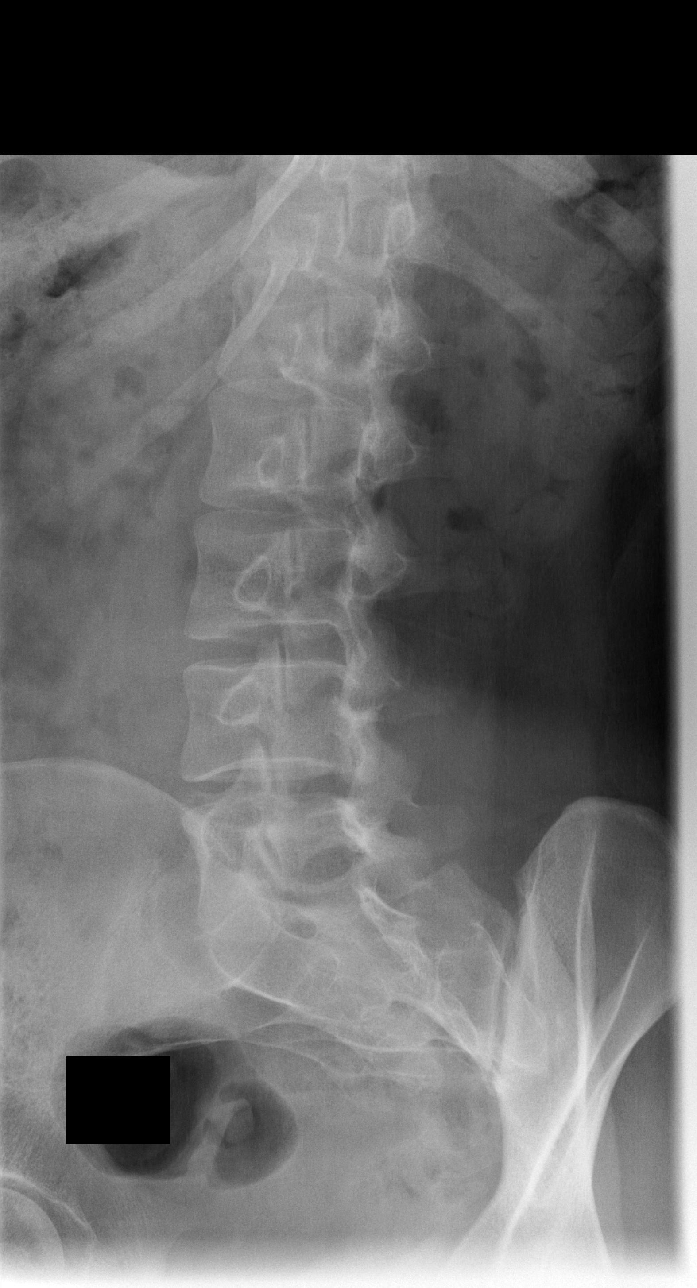

[t l-spine oblique exposure (2 of 2)]
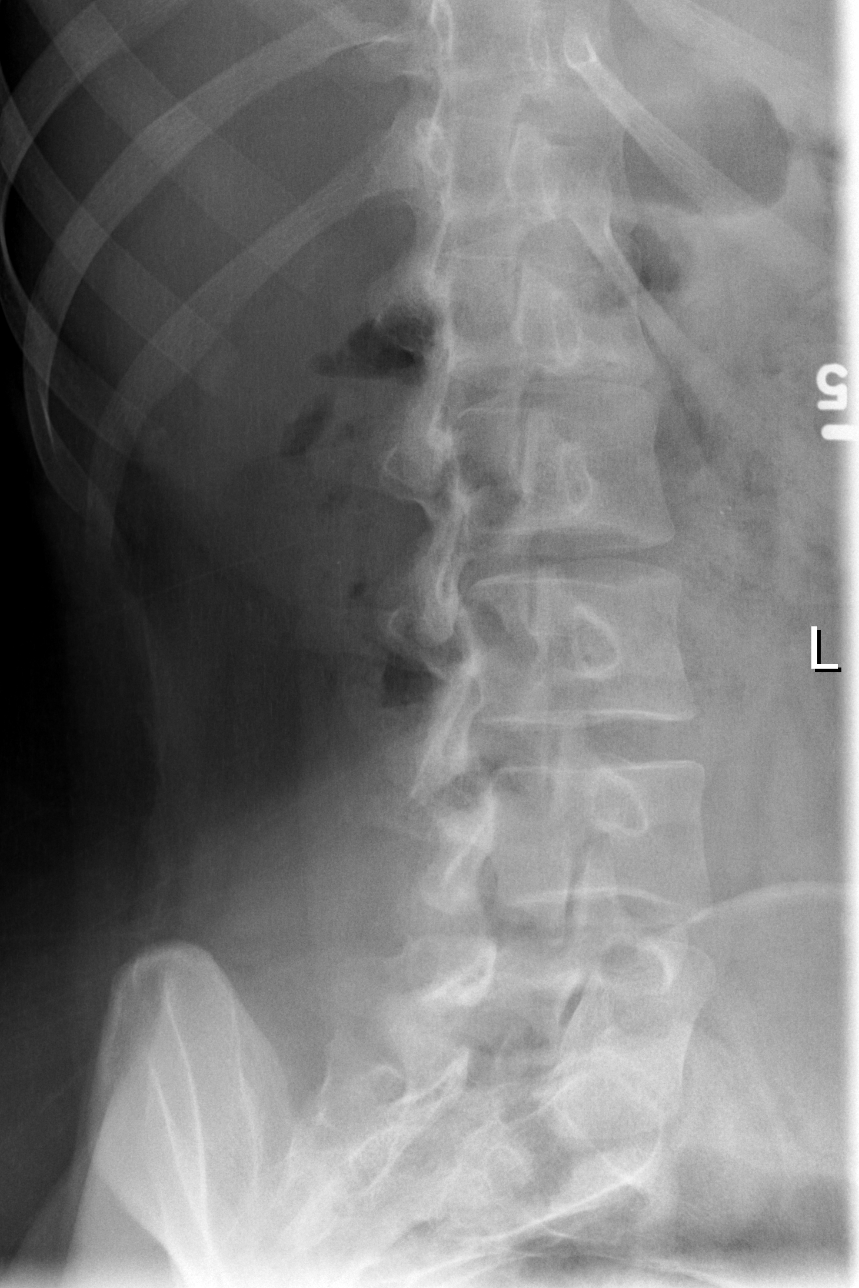

[t l-spine lat]
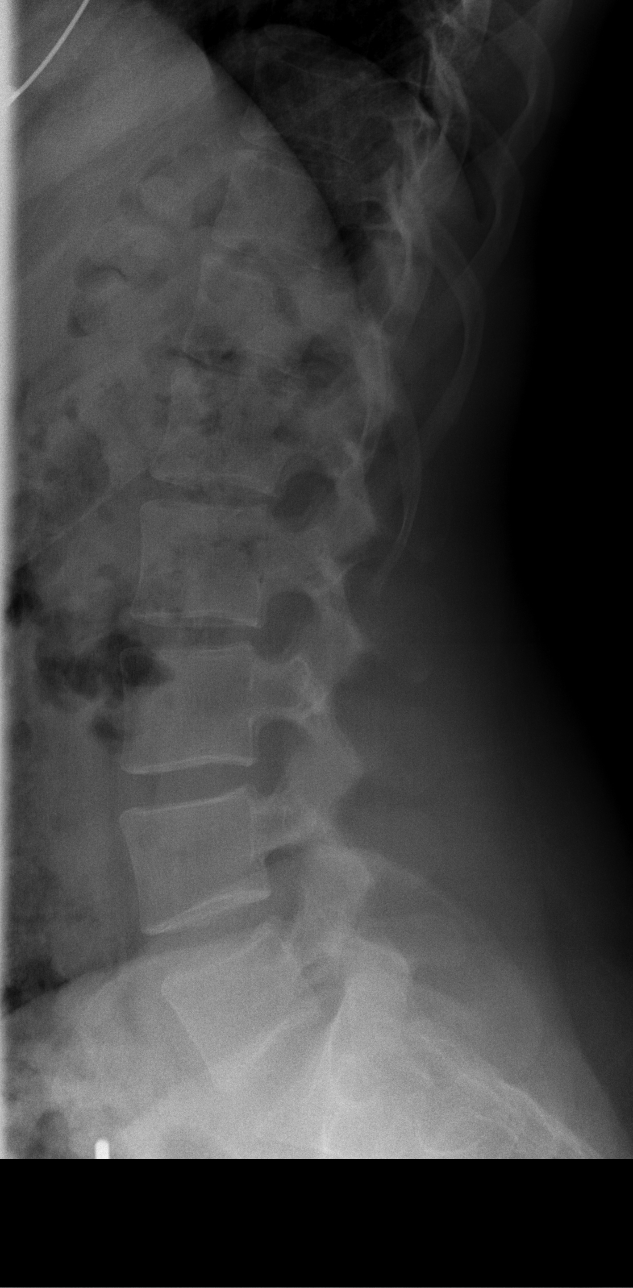

[t l-spine l5-s1 spot]
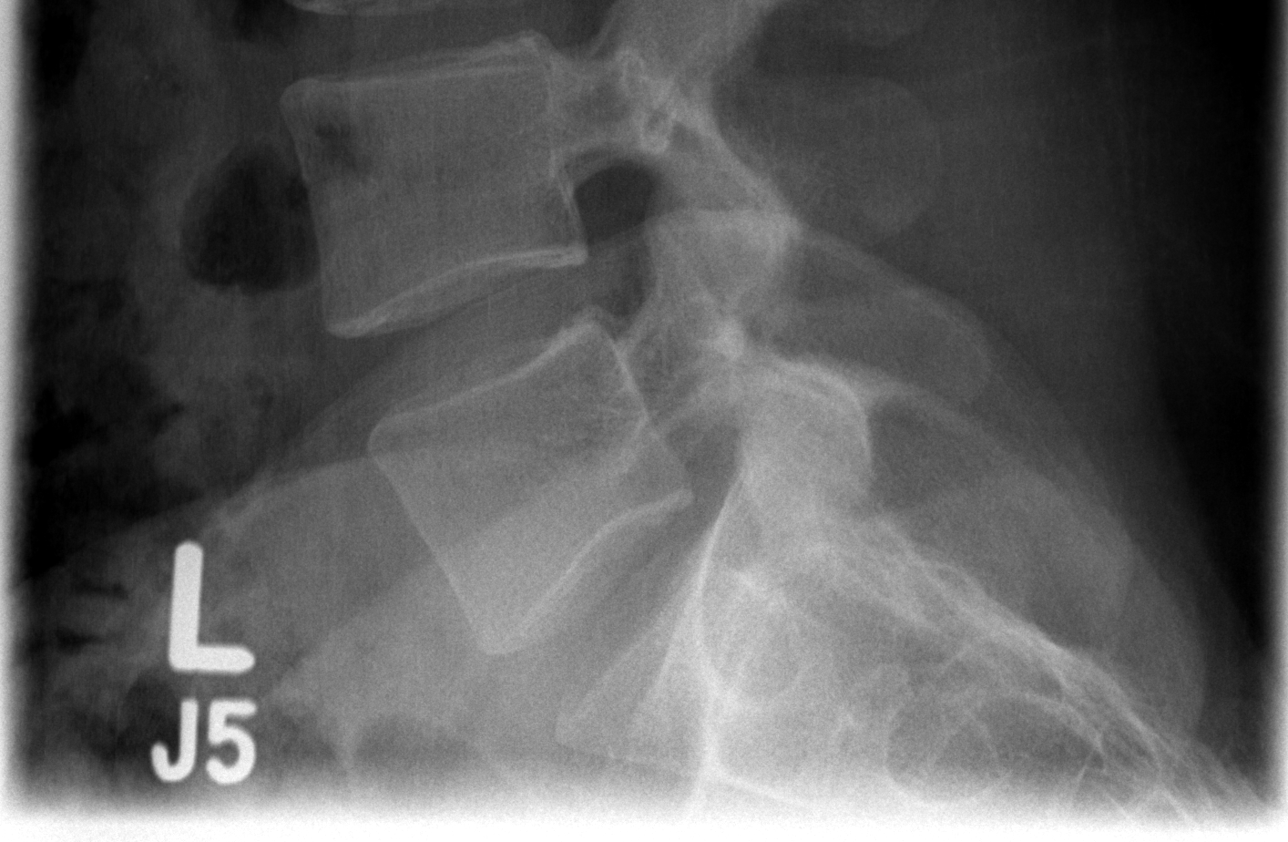

[5 of 5 positions shown; findings below may reference images not displayed]

CLINICAL DATA
Pain.

EXAM
LUMBAR SPINE - COMPLETE 4+ VIEW

COMPARISON
None.

FINDINGS
Paraspinal soft tissues are normal. No acute bony abnormality
identified. No evidence of fracture. Bony mineralization normal.
Pedicles are intact.

IMPRESSION
Negative.

SIGNATURE

## 2014-04-02 ENCOUNTER — Ambulatory Visit: Payer: Medicaid Other | Attending: Internal Medicine | Admitting: Physical Therapy

## 2014-04-02 DIAGNOSIS — IMO0001 Reserved for inherently not codable concepts without codable children: Secondary | ICD-10-CM | POA: Insufficient documentation

## 2014-04-02 DIAGNOSIS — M546 Pain in thoracic spine: Secondary | ICD-10-CM | POA: Diagnosis not present

## 2014-04-02 DIAGNOSIS — M545 Low back pain, unspecified: Secondary | ICD-10-CM | POA: Insufficient documentation

## 2014-04-24 ENCOUNTER — Ambulatory Visit: Payer: Medicaid Other | Admitting: Physical Therapy

## 2014-04-24 DIAGNOSIS — IMO0001 Reserved for inherently not codable concepts without codable children: Secondary | ICD-10-CM | POA: Diagnosis not present

## 2014-05-08 ENCOUNTER — Encounter: Payer: Medicaid Other | Admitting: Physical Therapy

## 2014-05-10 ENCOUNTER — Ambulatory Visit: Payer: Medicaid Other | Admitting: Physical Therapy

## 2014-05-14 ENCOUNTER — Encounter: Payer: Medicaid Other | Admitting: Physical Therapy

## 2014-05-16 ENCOUNTER — Encounter: Payer: Medicaid Other | Admitting: Physical Therapy

## 2014-07-28 ENCOUNTER — Emergency Department (HOSPITAL_COMMUNITY)
Admission: EM | Admit: 2014-07-28 | Discharge: 2014-07-28 | Disposition: A | Discharge status: Home / Self Care | Payer: Medicaid Other | Attending: Emergency Medicine | Admitting: Emergency Medicine

## 2014-07-28 ENCOUNTER — Encounter (HOSPITAL_COMMUNITY): Payer: Self-pay | Admitting: Emergency Medicine

## 2014-07-28 DIAGNOSIS — J45909 Unspecified asthma, uncomplicated: Secondary | ICD-10-CM | POA: Insufficient documentation

## 2014-07-28 DIAGNOSIS — Z880 Allergy status to penicillin: Secondary | ICD-10-CM | POA: Diagnosis not present

## 2014-07-28 DIAGNOSIS — N898 Other specified noninflammatory disorders of vagina: Secondary | ICD-10-CM | POA: Insufficient documentation

## 2014-07-28 DIAGNOSIS — Z8619 Personal history of other infectious and parasitic diseases: Secondary | ICD-10-CM | POA: Insufficient documentation

## 2014-07-28 DIAGNOSIS — Z3202 Encounter for pregnancy test, result negative: Secondary | ICD-10-CM | POA: Diagnosis not present

## 2014-07-28 DIAGNOSIS — Z720 Tobacco use: Secondary | ICD-10-CM | POA: Diagnosis not present

## 2014-07-28 DIAGNOSIS — Z87440 Personal history of urinary (tract) infections: Secondary | ICD-10-CM | POA: Diagnosis not present

## 2014-07-28 DIAGNOSIS — Z79899 Other long term (current) drug therapy: Secondary | ICD-10-CM | POA: Diagnosis not present

## 2014-07-28 LAB — URINALYSIS, ROUTINE W REFLEX MICROSCOPIC
Bilirubin Urine: NEGATIVE
Glucose, UA: NEGATIVE mg/dL
Hgb urine dipstick: NEGATIVE
Ketones, ur: NEGATIVE mg/dL
Nitrite: NEGATIVE
PROTEIN: NEGATIVE mg/dL
Specific Gravity, Urine: 1.01 (ref 1.005–1.030)
Urobilinogen, UA: 0.2 mg/dL (ref 0.0–1.0)
pH: 6.5 (ref 5.0–8.0)

## 2014-07-28 LAB — WET PREP, GENITAL
Clue Cells Wet Prep HPF POC: NONE SEEN
Trich, Wet Prep: NONE SEEN
Yeast Wet Prep HPF POC: NONE SEEN

## 2014-07-28 LAB — URINE MICROSCOPIC-ADD ON

## 2014-07-28 LAB — PREGNANCY, URINE: PREG TEST UR: NEGATIVE

## 2014-07-28 MED ORDER — ONDANSETRON 4 MG PO TBDP
4.0000 mg | ORAL_TABLET | Freq: Once | ORAL | Status: AC
  Administered 2014-07-28: 4 mg via ORAL
  Filled 2014-07-28: qty 1

## 2014-07-28 MED ORDER — AZITHROMYCIN 250 MG PO TABS
1000.0000 mg | ORAL_TABLET | Freq: Once | ORAL | Status: AC
  Administered 2014-07-28: 1000 mg via ORAL
  Filled 2014-07-28: qty 4

## 2014-07-28 MED ORDER — CEFTRIAXONE SODIUM 250 MG IJ SOLR
250.0000 mg | Freq: Once | INTRAMUSCULAR | Status: AC
  Administered 2014-07-28: 250 mg via INTRAMUSCULAR
  Filled 2014-07-28: qty 250

## 2014-07-28 MED ORDER — ONDANSETRON 4 MG PO TBDP
4.0000 mg | ORAL_TABLET | Freq: Once | ORAL | Status: DC
  Filled 2014-07-28: qty 1

## 2014-07-28 MED ORDER — STERILE WATER FOR INJECTION IJ SOLN
10.0000 mL | Freq: Once | INTRAMUSCULAR | Status: AC
  Administered 2014-07-28: 10 mL via INTRAMUSCULAR
  Filled 2014-07-28: qty 10

## 2014-07-28 NOTE — ED Provider Notes (Signed)
CSN: [id number]     Arrival date & time 07/28/14  1707 History   First MD Initiated Contact with Patient 07/28/14 1715     Chief Complaint  Patient presents with  . Vaginal Itching     (Consider location/radiation/quality/duration/timing/severity/associated sxs/prior Treatment) HPI  Patient to the ER with complaints of vaginal itching. She typically has itching because she shaves but feels this is different. She went to visit her family members who have herpes and she shared bath towels with them. She is worried she may have this. Her fiance who she has intercourse with does not have any penile lesions. They do not use condoms. She denies having discharge, vaginal bleeding, abdominal pains, nausea, vomiting, diarrhea, fevers.  Past Medical History  Diagnosis Date  . Infection   . UTI (lower urinary tract infection)   . Asthma   . Normal pregnancy, repeat 08/18/2013  . SVD (spontaneous vaginal delivery) 08/19/2013   Past Surgical History  Procedure Laterality Date  . No past surgeries     No family history on file. History  Substance Use Topics  . Smoking status: Current Every Day Smoker    Last Attempt to Quit: 10/31/2012  . Smokeless tobacco: Never Used  . Alcohol Use: No   OB History   Grav Para Term Preterm Abortions TAB SAB Ect Mult Living   2 2 2       2      Review of Systems  All other systems reviewed and are negative.     Allergies  Codeine and Penicillins  Home Medications   Prior to Admission medications   Medication Sig Start Date End Date Taking? Authorizing Provider  etonogestrel-ethinyl estradiol (NUVARING) 0.12-0.015 MG/24HR vaginal ring Place 1 each vaginally every 28 (twenty-eight) days. Insert vaginally and leave in place for 3 consecutive weeks, then remove for 1 week.   Yes Historical Provider, MD  methylphenidate 27 MG PO CR tablet Take 27 mg by mouth every morning.   Yes Historical Provider, MD  Prenatal Vit-Fe Fumarate-FA (PRENATAL  MULTIVITAMIN) TABS tablet Take 1 tablet by mouth daily at 12 noon. 08/20/13  Yes Jody [name]-Stuckert, MD  sertraline (ZOLOFT) 50 MG tablet Take 50 mg by mouth daily.   Yes Historical Provider, MD   BP 128/67  Pulse 64  Temp(Src) 97.5 F (36.4 C) (Oral)  Resp 18  SpO2 99%  LMP 07/20/2014 Physical Exam  Nursing note and vitals reviewed. Constitutional: She appears well-developed and well-nourished. No distress.  HENT:  Head: Normocephalic and atraumatic.  Eyes: Pupils are equal, round, and reactive to light.  Neck: Normal range of motion. Neck supple.  Cardiovascular: Normal rate and regular rhythm.   Pulmonary/Chest: Effort normal.  Abdominal: Soft.  Genitourinary: Uterus normal. Cervix exhibits no motion tenderness, no discharge and no friability. Right adnexum displays no tenderness. Left adnexum displays no tenderness. No tenderness or bleeding around the vagina. No foreign body around the vagina. Vaginal discharge found.  Neurological: She is alert.  Skin: Skin is warm and dry.    ED Course  Procedures (including critical care time) Labs Review Labs Reviewed  WET PREP, GENITAL - Abnormal; Notable for the following:    WBC, Wet Prep HPF POC MANY (*)    All other components within normal limits  URINALYSIS, ROUTINE W REFLEX MICROSCOPIC - Abnormal; Notable for the following:    Leukocytes, UA TRACE (*)    All other components within normal limits  GC/CHLAMYDIA PROBE AMP  PREGNANCY, URINE  URINE MICROSCOPIC-ADD ON  Imaging Review No results found.   EKG Interpretation None      MDM   Final diagnoses:  Vaginal irritation   Patient has MANY WBC's. Discussed the concern for possible STD. She says she has not had sex with anyone else but her fiance. Advised to refrain from sexual intercourse for 1 week. GC cultures pending. Will be called for a positive result only. No systemic symptoms present.   Medications  cefTRIAXone (ROCEPHIN) injection 250 mg (250 mg  Intramuscular Given 07/28/14 2020)  azithromycin (ZITHROMAX) tablet 1,000 mg (1,000 mg Oral Given 07/28/14 2019)  ondansetron (ZOFRAN-ODT) disintegrating tablet 4 mg (4 mg Oral Given 07/28/14 2020)  sterile water (preservative free) injection 10 mL (10 mLs Injection Given 07/28/14 2020)    18 y.o.Mikayla [name]'s evaluation in the Emergency Department is complete. It has been determined that no acute conditions requiring further emergency intervention are present at this time. The patient/guardian have been advised of the diagnosis and plan. We have discussed signs and symptoms that warrant return to the ED, such as changes or worsening in symptoms.  Vital signs are stable at discharge. Filed Vitals:   07/28/14 2008  BP: 127/65  Pulse: 59  Temp: 98.4 F (36.9 C)  Resp: 16    Patient/guardian has voiced understanding and agreed to follow-up with the PCP or specialist.      Dorthula Matas, PA-C 07/28/14 2023

## 2014-07-28 NOTE — ED Notes (Signed)
Pt presents with c/o vaginal itching. Pt reports that she feels the itching mainly on the inside but she noticed the itching after she shaved sometime last week. Pt denies any discharge or pain at this time. Pt denies any hematuria or pain with urination.

## 2014-07-28 NOTE — ED Provider Notes (Signed)
Medical screening examination/treatment/procedure(s) were performed by non-physician practitioner and as supervising physician I was immediately available for consultation/collaboration.    Vida RollerBrian D Shivank Pinedo, MD 07/28/14 [phone]

## 2014-07-28 NOTE — Discharge Instructions (Signed)
RESOURCE GUIDE ° °Chronic Pain Problems: °Contact [facility]  [phone] °Patients need to be referred by their primary care doctor. ° °Insufficient Money for Medicine: °Contact [employer]:  call "211" or [facility] 271-5999. ° °No Primary Care Doctor: °Call [facility]  [phone] - can help you locate a primary care doctor that  accepts your insurance, provides certain services, etc. °Physician Referral Service- [phone] ° °Agencies that provide inexpensive medical care: °[facility]  [phone] °[facility]  [phone] °[facility]  [phone] °Women's Clinic  [phone] °Planned Parenthood  [phone] °[facility]  [phone] ° °Medicaid-accepting [place] Providers: °[facility]- [address] ° [phone], Mon-Fri 9am-7pm, Sat 9am-1pm °[facility]- [address], [address] ° [phone] °[facility]- [address], [address] ° [phone] °[facility]- [address] [place] [address] ° [phone] °[facility]- [address], [phone] ° Only accepts [place] Access Medicaid patients after they have their name  applied to their card ° °Self Pay (no insurance) in [place]: °Sickle Cell Patients: Dr Eric Dean, [facility] ° [address], [phone] °[facility]- [address] ° [phone] °      -     [facility] [place]- [address] °      -     [facility]- see information above (Speak to Pam H if you do not have insurance) °      -  [facility]- [address], [phone] °      -  [facility] [place]- [address],  [phone] °      -  [facility]- [address] [place] [address], [phone] °      -  Dr Osei-Bonsu-  [address], [place], [phone] °      -  [facility]- [address], [phone] °      -   [facility]- [address] [place] [address], [phone], also [address], [phone] °      -    [facility]- [address], [phone], 1st & 3rd Saturday   every month, 10am-1pm ° °1) Find a Doctor and Pay Out of Pocket °Although you won't have to find out who is covered by your insurance plan, it is a good idea to ask around and get recommendations. You will then need to call the office and see if the doctor you have chosen will accept you as a new patient and what types of options they offer for patients who are self-pay. Some doctors offer discounts or will set up payment plans for their patients who do not have insurance, but you will need to ask so you aren't surprised when you get to your appointment. ° °2) Contact Your Local Health Department °Not all health departments have doctors that can see patients for sick visits, but many do, so it is worth a call to see if yours does. If you don't know where your local health department is, you can check in your phone book. The CDC also has a tool to help you locate your state's health department, and many state websites also have listings of all of their local health departments. ° °3) Find a Walk-in Clinic °If   your illness is not likely to be very severe or complicated, you may want to try a walk in clinic. These are popping up all over the country in pharmacies, drugstores, and shopping centers. They're usually staffed by nurse practitioners or physician assistants that have been trained to treat common illnesses and complaints. They're usually fairly quick and inexpensive. However, if you have serious medical issues or chronic medical problems, these are probably not your best option  [facility] [place], [facility], [address] [place], phone [phone] or [phone].  Monday - Friday, call for an appointment. [place], [facility], [address], [place], phone [phone] or [phone].  Monday - Friday, call for an appointment.  Abuse/Neglect: [place] [phone] [place] [phone] (After Hours)  Emergency Shelter:  [facility] [phone]  Maternity Homes: [facility] [place] [phone] [place] [phone]  MRSA Hotline #:   859-137-9805  [facility] Dept. [address]         [place] Phone:  [phone]                                  Phone:  [phone]                   Phone:  [phone]  [facility], [place]- [phone]       -     [facility] in [place], [address]                                  [place] [phone] or [phone] (After Hours)   [place]  Substance Abuse Resources: Alcohol and Drug Services  [phone] [place] [phone] [facility] Chinita Pester 5023251072 Residential & Outpatient Substance Abuse Program  2178350694  Psychological Services: [place]  9148575633 [place]  [place], Tina. [address], [place], [place]: [phone] or [phone], [email or web address]  Dental Assistance  If unable to pay or uninsured, contact:  [facility] or [facility]. to become qualified for the adult dental clinic.  Patients with Medicaid: [facility] [phone] [address], [place] [address] [phone]  If unable to pay, or uninsured, contact [facility] [phone]) or [place] [phone] in [place], [place] in [facility]) to become qualified for the adult dental clinic  Other Low-Cost  [employer] Services: [facility]- [address] [place], [place], [address], [place], [address], 2nd and 4th Thursday of the month at 6:30am.  10 clients each day by appointment, can sometimes see walk-in patients if someone does not show for an appointment. [facility]- [address] [place], [place], [address], [place], [place], [place], [address], [place] [facility] [phone]  Please make every effort to establish with a primary care physician for routine medical care  [place]  The [place] provides a wide range of adult health services. Some of these services are designed to address the healthcare needs of all [facility] residents and all services are designed to meet the needs of uninsured/underinsured low income residents. Some services are available to any resident of [place], call [phone] for details. ] [facility], a new medical clinic for adults, is now open. For more information about the Center and its services please call [phone]. For information on our [place] services, click here.  For more information on any of the following Department of Public Health programs, including hours of service, click on the highlighted link.  SERVICES FOR WOMEN (Adults and Teens) [employer] provide a full range of birth control options plus education and counseling. New patient visit and annual return visits include a complete  examination, pap test as indicated, and other laboratory as indicated. Included is our [employer] for men.  Maternity Care is provided through pregnancy, including a six week post partum exam. Women who meet eligibility criteria for the Medicaid for Pregnant Women program, receive care free. Other women are charged on a sliding scale according to income. Note: [facility] provides services to pregnant women who have a Medicaid card. Call [phone] for an appointment in [place] or [phone] for an appointment in [facility].  Primary Care for Medicaid [place] Access Women is available through the [place]. As primary care provider for the [place] program, women may designate the [facility] clinic as their primary care provider.  PLEASE CALL R5958090 FOR AN APPOINTMENT FOR THE ABOVE SERVICES IN EITHER [place] OR [place]. Information available in [place] and [place].   Childbirth Education Classes are open to the public and offered to help families prepare for the best possible childbirth experience as well as to promote lifelong health and wellness. Classes are offered throughout the year and meet on the same night once a week for five weeks. Medicaid covers the cost of the classes for the mother-to-be and her partner. For participants without Medicaid, the cost of the class series is $45.00 for the mother-to-be and her partner. Class size is limited and registration is required. For more information or to register call [phone]. Baby items donated by [employer] and the [employer] [place] are given away during each class series.  SERVICES FOR WOMEN AND MEN Sexually Transmitted Infection appointments, including HIV testing, are available daily (weekdays, except holidays). Call early as same-day appointments are limited. For an appointment in either [facility] or [place],  call [phone]. Services are confidential and free of charge.  Skin Testing for Tuberculosis Please call [phone]. Adult Immunizations are available, usually for a fee. Please call [phone] for details.  PLEASE CALL [phone] FOR AN APPOINTMENT FOR THE ABOVE  SERVICES IN EITHER [place] OR [place].   [facility] provides up to the minute recommended vaccines for your travel destination. We also provide essential health and political information to help insure a safe and pleasurable travel experience. This program is self-sustaining, however, fees are very competitive. We are a CERTIFIED YELLOW FEVER IMMUNIZATION approved clinic site. PLEASE CALL [phone] FOR AN APPOINTMENT IN EITHER [place] OR [place].   If you have questions about the services listed above, we want to answer them! Email [place] at: [email or web address] Home Visiting Services for elderly and the disabled are available to residents of [facility] who are in need of care that compares to the care offered by a nursing home, have needs that can be met by the program, and have [place] Medicaid. Other short term services are available to residents 18 years and older who are unable to meet requirements for eligibility to receive services from a certified home health agency, spend the majority of time at home, and need care for six months or less.  PLEASE CALL [phone] OR [phone] FOR MORE INFORMATION. Medication Assistance Program serves as a link between pharmaceutical companies and patients to provide low cost or free prescription medications. This servce is available for residents who meet certain income restrictions and have no insurance coverage.  PLEASE CALL [phone] ([place]) OR [phone] ([place]) FOR MORE INFORMATION.  Updated Feb. 21, 2013

## 2014-07-30 LAB — GC/CHLAMYDIA PROBE AMP
CT Probe RNA: NEGATIVE
GC Probe RNA: NEGATIVE

## 2014-08-01 ENCOUNTER — Ambulatory Visit (INDEPENDENT_AMBULATORY_CARE_PROVIDER_SITE_OTHER): Payer: Medicaid Other | Admitting: Neurology

## 2014-08-01 ENCOUNTER — Encounter: Payer: Self-pay | Admitting: Neurology

## 2014-08-01 VITALS — Ht 65.0 in | Wt 181.0 lb

## 2014-08-01 DIAGNOSIS — G43909 Migraine, unspecified, not intractable, without status migrainosus: Secondary | ICD-10-CM | POA: Insufficient documentation

## 2014-08-01 DIAGNOSIS — G43009 Migraine without aura, not intractable, without status migrainosus: Secondary | ICD-10-CM

## 2014-08-01 MED ORDER — TOPIRAMATE 100 MG PO TABS: 100.0000 mg | ORAL_TABLET | Freq: Two times a day (BID) | ORAL | Status: DC

## 2014-08-01 MED ORDER — RIZATRIPTAN BENZOATE 5 MG PO TBDP: 5.0000 mg | ORAL_TABLET | ORAL | Status: DC | PRN

## 2014-08-01 NOTE — Progress Notes (Signed)
PATIENT: Mikayla [name] DOB: 05/27/1996  HISTORICAL  Mikayla [name] is a 18 yo LH female, referred by her optometrist Dr. 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  She began to have frequent headaches since she had her second child about a year ago in 2014, to the point of daily basis, she complains of frequent episode of bitemporal region pressure headaches, with light sensitivity, no significant nausea, movements make it worse, sleep usually helps, lasting for couple hours, she has multiple recurrent episodes in a day, she has tried over-the-counter medications, such as Tylenol, ibuprofen, without helping,   she denies visual change, she denied lateralized motor or sensory deficit, trigger for her headaches are stress, sleep deprivation is,  She also complains of excessive weight gain from 130 to 180 over past 1 year    REVIEW OF SYSTEMS: Full 14 system review of systems performed and notable only for  headaches   ALLERGIES: Allergies  Allergen Reactions  . Codeine Swelling  . Penicillins Swelling    HOME MEDICATIONS: Current Outpatient Prescriptions on File Prior to Visit  Medication Sig Dispense Refill  . etonogestrel-ethinyl estradiol (NUVARING) 0.12-0.015 MG/24HR vaginal ring Place 1 each vaginally every 28 (twenty-eight) days. Insert vaginally and leave in place for 3 consecutive weeks, then remove for 1 week.      . methylphenidate 27 MG PO CR tablet Take 27 mg by mouth  every morning.      . Prenatal Vit-Fe Fumarate-FA (PRENATAL MULTIVITAMIN) TABS tablet Take 1 tablet by mouth daily at 12 noon.  30 tablet  12  . sertraline (ZOLOFT) 50 MG tablet Take 50 mg by mouth daily.         PAST MEDICAL HISTORY: Past Medical History  Diagnosis Date  . Infection   . UTI (lower urinary tract infection)   . Asthma   . Normal pregnancy, repeat 08/18/2013  . SVD (spontaneous vaginal delivery) 08/19/2013    PAST SURGICAL HISTORY: Past Surgical History  Procedure Laterality Date  . No past surgeries      FAMILY HISTORY: History reviewed. No pertinent family history.  SOCIAL HISTORY:  History   Social History  . Marital Status: Single    Spouse Name: N/A    Number of Children: 2, children, 382, and one year old  . Years of Education: N/A   Occupational History  . Stay at home.   Social History Main Topics  . Smoking status: Current Every Day Smoker    Last Attempt to Quit: 10/31/2012  . Smokeless tobacco: Never Used  . Alcohol Use: No  . Drug Use: No  . Sexual Activity: Yes    Birth Control/ Protection: None   Other Topics Concern  . Not on file   Social History Narrative  . No narrative on file     PHYSICAL EXAM   Filed Vitals:   08/01/14 1213  Height: 5\' 5"  (1.651 m)  Weight: 181 lb (82.101 kg)    Not recorded  Body mass index is 30.12 kg/(m^2).   Generalized: In no acute distress  Neck: Supple, no carotid bruits   Cardiac: Regular rate rhythm  Pulmonary: Clear to auscultation bilaterally  Musculoskeletal: No deformity  Neurological examination  Mentation: Alert oriented to time, place, history taking, and causual conversation  Cranial nerve II-XII: Pupils were equal round reactive to light. Extraocular movements were full.  Visual field were full on confrontational test. Bilateral fundi showed mild blurry of bilateral optic nerve endings.  Facial sensation and strength were normal. Hearing was intact to finger rubbing  bilaterally. Uvula tongue midline.  Head turning and shoulder shrug and were normal and symmetric.Tongue protrusion into cheek strength was normal.  Motor: Normal tone, bulk and strength.  Sensory: Intact to fine touch, pinprick, preserved vibratory sensation, and proprioception at toes.  Coordination: Normal finger to nose, heel-to-shin bilaterally there was no truncal ataxia  Gait: Rising up from seated position without assistance, normal stance, without trunk ataxia, moderate stride, good arm swing, smooth turning, able to perform tiptoe, and heel walking without difficulty.   Romberg signs: Negative  Deep tendon reflexes: Brachioradialis 2/2, biceps 2/2, triceps 2/2, patellar 2/2, Achilles 2/2, plantar responses were flexor bilaterally.   DIAGNOSTIC DATA (LABS, IMAGING, TESTING) - I reviewed patient records, labs, notes, testing and imaging myself where available.  Lab Results  Component Value Date   WBC 14.8* 08/20/2013   HGB 10.1* 08/20/2013   HCT 31.8* 08/20/2013   MCV 78.5 08/20/2013   PLT 254 08/20/2013   ASSESSMENT AND PLAN  [name] is a 18 y.o. 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 MRI of brain  2. Topamax 100 mg twice a day as migraine prevention, hopefully weight loss  3. Maxalt as needed 4, if she continued to have frequent headaches, may consider lumbar puncture   Levert Feinstein, M.D. Ph.D.  [facility] [address] [place], [place] [address] [phone]

## 2014-08-02 ENCOUNTER — Telehealth: Payer: Self-pay | Admitting: Neurology

## 2014-08-02 LAB — CBC WITH DIFFERENTIAL
Basophils Absolute: 0 10*3/uL (ref 0.0–0.2)
Basos: 0 %
Eos: 2 %
Eosinophils Absolute: 0.2 10*3/uL (ref 0.0–0.4)
HEMATOCRIT: 41.4 % (ref 34.0–46.6)
Hemoglobin: 13.7 g/dL (ref 11.1–15.9)
Immature Grans (Abs): 0 10*3/uL (ref 0.0–0.1)
Immature Granulocytes: 0 %
LYMPHS ABS: 2.1 10*3/uL (ref 0.7–3.1)
Lymphs: 23 %
MCH: 25.4 pg — AB (ref 26.6–33.0)
MCHC: 33.1 g/dL (ref 31.5–35.7)
MCV: 77 fL — AB (ref 79–97)
MONOS ABS: 0.5 10*3/uL (ref 0.1–0.9)
Monocytes: 6 %
Neutrophils Absolute: 6.3 10*3/uL (ref 1.4–7.0)
Neutrophils Relative %: 69 %
Platelets: 270 10*3/uL (ref 150–379)
RBC: 5.4 x10E6/uL — AB (ref 3.77–5.28)
RDW: 14 % (ref 12.3–15.4)
WBC: 9.1 10*3/uL (ref 3.4–10.8)

## 2014-08-02 LAB — COMPREHENSIVE METABOLIC PANEL
ALBUMIN: 4.3 g/dL (ref 3.5–5.5)
ALT: 7 IU/L (ref 0–32)
AST: 15 IU/L (ref 0–40)
Albumin/Globulin Ratio: 1.5 (ref 1.1–2.5)
Alkaline Phosphatase: 109 IU/L (ref 39–117)
BUN/Creatinine Ratio: 9 (ref 8–20)
BUN: 8 mg/dL (ref 6–20)
CALCIUM: 9.4 mg/dL (ref 8.7–10.2)
CO2: 23 mmol/L (ref 18–29)
Chloride: 101 mmol/L (ref 97–108)
Creatinine, Ser: 0.92 mg/dL (ref 0.57–1.00)
GFR calc Af Amer: 104 mL/min/{1.73_m2} (ref >59)
GFR, EST NON AFRICAN AMERICAN: 90 mL/min/{1.73_m2} (ref >59)
GLOBULIN, TOTAL: 2.8 g/dL (ref 1.5–4.5)
Glucose: 82 mg/dL (ref 65–99)
Potassium: 4.3 mmol/L (ref 3.5–5.2)
SODIUM: 139 mmol/L (ref 134–144)
Total Bilirubin: 0.2 mg/dL (ref 0.0–1.2)
Total Protein: 7.1 g/dL (ref 6.0–8.5)

## 2014-08-02 LAB — RPR: SYPHILIS RPR SCR: NONREACTIVE

## 2014-08-02 LAB — FOLATE: Folate: >19.9 ng/mL (ref >3.0)

## 2014-08-02 LAB — SEDIMENTATION RATE: Sed Rate: 9 mm/hr (ref 0–32)

## 2014-08-02 LAB — HIV ANTIBODY (ROUTINE TESTING W REFLEX)
HIV 1/O/2 Abs-Index Value: <1 (ref <1.00)
HIV-1/HIV-2 Ab: NONREACTIVE

## 2014-08-02 LAB — VITAMIN B12: Vitamin B-12: 626 pg/mL (ref 211–946)

## 2014-08-02 LAB — TSH: TSH: 1.35 u[IU]/mL (ref 0.450–4.500)

## 2014-08-02 LAB — C-REACTIVE PROTEIN: CRP: 9 mg/L — ABNORMAL HIGH (ref 0.0–4.9)

## 2014-08-02 NOTE — Telephone Encounter (Signed)
I have called her, lab showed small RBC, normal b12, she should take iron supplement, previous evidence of anemia, hemoglobin of 9.1

## 2014-08-24 ENCOUNTER — Inpatient Hospital Stay: Admission: RE | Admit: 2014-08-24 | Payer: Medicaid Other | Source: Ambulatory Visit

## 2014-08-27 ENCOUNTER — Encounter: Payer: Self-pay | Admitting: Neurology

## 2014-08-28 ENCOUNTER — Encounter (INDEPENDENT_AMBULATORY_CARE_PROVIDER_SITE_OTHER): Payer: Medicaid Other | Admitting: Diagnostic Neuroimaging

## 2014-08-28 ENCOUNTER — Ambulatory Visit
Admission: RE | Admit: 2014-08-28 | Discharge: 2014-08-28 | Disposition: A | Discharge status: Home / Self Care | Payer: Medicaid Other | Source: Ambulatory Visit | Attending: Neurology | Admitting: Neurology

## 2014-08-28 DIAGNOSIS — G43009 Migraine without aura, not intractable, without status migrainosus: Secondary | ICD-10-CM

## 2014-08-31 NOTE — Progress Notes (Signed)
Quick Note:  Called and spoke to patient told her normal MRI. Patient understood ______ 

## 2014-09-04 ENCOUNTER — Ambulatory Visit: Payer: Medicaid Other | Admitting: Neurology

## 2014-09-05 ENCOUNTER — Ambulatory Visit: Payer: Self-pay | Admitting: Neurology

## 2014-09-13 ENCOUNTER — Ambulatory Visit (INDEPENDENT_AMBULATORY_CARE_PROVIDER_SITE_OTHER): Payer: Medicaid Other | Admitting: Neurology

## 2014-09-13 ENCOUNTER — Encounter: Payer: Self-pay | Admitting: Neurology

## 2014-09-13 VITALS — BP 105/71 | HR 64 | Ht 65.0 in | Wt 178.0 lb

## 2014-09-13 DIAGNOSIS — G932 Benign intracranial hypertension: Secondary | ICD-10-CM

## 2014-09-13 MED ORDER — BUTALBITAL-APAP-CAFFEINE 50-325-40 MG PO TABS: 1.0000 | ORAL_TABLET | Freq: Four times a day (QID) | ORAL | Status: DC | PRN

## 2014-09-13 NOTE — Progress Notes (Signed)
PATIENT: Mikayla [name] DOB: 30-Jan-1996  HISTORICAL  Mikayla SpangleDeaspnn C [name] is a 18 yo LH female, referred by her optometrist Dr. 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  She began to have frequent headaches since she had her second child about a year ago in 2014, to the point of daily basis, she complains of frequent episode of bitemporal region pressure headaches, with light sensitivity, no significant nausea, movements make it worse, sleep usually helps, lasting for couple hours, she has multiple recurrent episodes in a day, she has tried over-the-counter medications, such as Tylenol, ibuprofen, without helping,   she denies visual change, she denied lateralized motor or sensory deficit, trigger for her headaches are stress, sleep deprivation is,  She also complains of excessive weight gain from 130 to 180 over past 1 year   UPDATE Nov 19th 2015:  MRI of the brain was normal, she continue have daily headaches, is tolerating and compliant with Topamax 100 mg twice a day, she denies significant change of her headaches, She often wake up with bilateral frontal, temporal region pressure headaches,  Maxalt was not helpful, ibuprofen was not helpful either, she denies visual change   REVIEW OF SYSTEMS: Full 14 system review of systems performed and notable only for  headaches   ALLERGIES: Allergies  Allergen Reactions  . Codeine Swelling  . Penicillins Swelling    HOME  MEDICATIONS: Current Outpatient Prescriptions on File Prior to Visit  Medication Sig Dispense Refill  . etonogestrel-ethinyl estradiol (NUVARING) 0.12-0.015 MG/24HR vaginal ring Place 1 each vaginally every 28 (twenty-eight) days. Insert vaginally and leave in place for 3 consecutive weeks, then remove for 1 week.      . methylphenidate 27 MG PO CR tablet Take 27 mg by mouth every morning.      . Prenatal Vit-Fe Fumarate-FA (PRENATAL MULTIVITAMIN) TABS tablet Take 1 tablet by mouth daily at 12 noon.  30 tablet  12  . sertraline (ZOLOFT) 50 MG tablet Take 50 mg by mouth daily.         PAST MEDICAL HISTORY: Past Medical History  Diagnosis Date  . Infection   . UTI (lower urinary tract infection)   . Asthma   . Normal pregnancy, repeat 08/18/2013  . SVD (spontaneous vaginal delivery) 08/19/2013    PAST SURGICAL HISTORY: Past Surgical History  Procedure Laterality Date  . No past surgeries      FAMILY HISTORY: History reviewed. No pertinent family history.  SOCIAL HISTORY:  History   Social History  . Marital Status: Single    Spouse Name: N/A    Number of Children: 2, children, 20, and one year old  . Years of Education: N/A   Occupational History  . Stay at home.   Social History Main Topics  . Smoking status: Current Every Day Smoker    Last Attempt to Quit: 10/31/2012  . Smokeless tobacco: Never Used  . Alcohol Use: No  . Drug Use: No  . Sexual Activity: Yes  Birth Control/ Protection: None   Other Topics Concern  . Not on file   Social History Narrative  . No narrative on file     PHYSICAL EXAM   Filed Vitals:   09/13/14 1507  BP: 105/71  Pulse: 64  Height: 5\' 5"  (1.651 m)  Weight: 178 lb (80.74 kg)    Not recorded      Body mass index is 29.62 kg/(m^2).   Generalized: In no acute distress  Neck: Supple, no carotid bruits   Cardiac: Regular rate rhythm  Pulmonary: Clear to auscultation bilaterally  Musculoskeletal: No  deformity  Neurological examination  Mentation: Alert oriented to time, place, history taking, and causual conversation  Cranial nerve II-XII: Pupils were equal round reactive to light. Extraocular movements were full.  Visual field were full on confrontational test. Bilateral fundi showed mild blurry of bilateral optic nerve endings.  Facial sensation and strength were normal. Hearing was intact to finger rubbing bilaterally. Uvula tongue midline.  Head turning and shoulder shrug and were normal and symmetric.Tongue protrusion into cheek strength was normal.  Motor: Normal tone, bulk and strength.  Sensory: Intact to fine touch, pinprick, preserved vibratory sensation, and proprioception at toes.  Coordination: Normal finger to nose, heel-to-shin bilaterally there was no truncal ataxia  Gait: Rising up from seated position without assistance, normal stance, without trunk ataxia, moderate stride, good arm swing, smooth turning, able to perform tiptoe, and heel walking without difficulty.   Romberg signs: Negative  Deep tendon reflexes: Brachioradialis 2/2, biceps 2/2, triceps 2/2, patellar 2/2, Achilles 2/2, plantar responses were flexor bilaterally.   DIAGNOSTIC DATA (LABS, IMAGING, TESTING) - I reviewed patient records, labs, notes, testing and imaging myself where available.  Lab Results  Component Value Date   WBC 9.1 08/01/2014   HGB 13.7 08/01/2014   HCT 41.4 08/01/2014   MCV 77* 08/01/2014   PLT 270 08/01/2014   ASSESSMENT AND PLAN  Mikayla SpangleDeaspnn C [name] is a 18 y.o. female complains of frequent headaches, with migraine features, funduscopy examination does demonstrate bilateral blurred optic nerve disc, she reported 40 pound weight gain over 1 year, MRI of the brain was normal, she has been taking Topamax 100 mg twice a day, continue complains of almost daily headaches, funduscopy examination, continued to demonstrate blurry edge of bilateral optic nerve  1, consistent with  pseudotumor 3 primary 2. Will refer her to fluoroscopy guided lumbar puncture 3. Return to clinic in 1 month     Levert FeinsteinYijun Rissie Sculley, M.D. Ph.D.  [facility] [address], [address] [place], [place] [address] [phone]

## 2014-09-26 ENCOUNTER — Ambulatory Visit
Admission: RE | Admit: 2014-09-26 | Discharge: 2014-09-26 | Disposition: A | Discharge status: Home / Self Care | Payer: Medicaid Other | Source: Ambulatory Visit | Attending: Neurology | Admitting: Neurology

## 2014-09-26 ENCOUNTER — Other Ambulatory Visit: Payer: Self-pay | Admitting: Neurology

## 2014-09-26 DIAGNOSIS — G932 Benign intracranial hypertension: Secondary | ICD-10-CM

## 2014-09-26 LAB — CSF CELL COUNT WITH DIFFERENTIAL
RBC Count, CSF: 34 cu mm — ABNORMAL HIGH
TUBE #: 4
WBC, CSF: 1 cu mm (ref 0–5)

## 2014-09-26 LAB — GRAM STAIN
GRAM STAIN: NONE SEEN
GRAM STAIN: NONE SEEN

## 2014-09-26 LAB — GLUCOSE, CSF: Glucose, CSF: 57 mg/dL (ref 43–76)

## 2014-09-26 LAB — PROTEIN, CSF: TOTAL PROTEIN, CSF: 31 mg/dL (ref 15–45)

## 2014-09-26 NOTE — Discharge Instructions (Signed)

## 2014-09-27 NOTE — Progress Notes (Signed)
Quick Note:  Release result to my chart, mild elevated RBC on csf could due to traumatic tap, otherwise normal csf, and normal open pressure. ______

## 2014-09-28 LAB — VDRL, CSF: VDRL Quant, CSF: NONREACTIVE

## 2014-10-08 ENCOUNTER — Ambulatory Visit (INDEPENDENT_AMBULATORY_CARE_PROVIDER_SITE_OTHER): Payer: Medicaid Other | Admitting: Neurology

## 2014-10-08 ENCOUNTER — Encounter: Payer: Self-pay | Admitting: Neurology

## 2014-10-08 VITALS — BP 118/70 | HR 62 | Ht 65.0 in | Wt 172.0 lb

## 2014-10-08 DIAGNOSIS — G43009 Migraine without aura, not intractable, without status migrainosus: Secondary | ICD-10-CM

## 2014-10-08 NOTE — Progress Notes (Signed)
PATIENT: Mikayla [name] DOB: 11-Mar-1996  HISTORICAL  Mikayla [name] is a 18 yo LH female, referred by her optometrist Dr. Santiago Bumpershurmond for evaluation of possible optic nerve swelling  She had her scheduled eye check in July 16 2014, because this is the time for her to get examination for new glasses, was found to have blurred bilateral optic nerve margin, her last eye check was 2013  She denies visual change, denied visual field loss, she is a mother of 880 years old, and 18 years old,   She reported a history of headaches since age 18, her typical headaches are bilateral temporal region pressure, pounding headaches, with associated light noise sensitivity.  She began to have frequent headaches since she had her second child about a year ago in 2014, to the point of daily basis, she complains of frequent episode of bitemporal region pressure headaches, with light sensitivity, no significant nausea, movements make it worse, sleep usually helps, lasting for couple hours, she has multiple recurrent episodes in a day, she has tried over-the-counter medications, such as Tylenol, ibuprofen, without helping,   she denies visual change, she denied lateralized motor or sensory deficit, trigger for her headaches are stress, sleep deprivation is,  She also complains of excessive weight gain from 130 to 180 over past 1 year   UPDATE Nov 19th 2015:  MRI of the brain was normal, she continue have daily headaches, is tolerating and compliant with Topamax 100 mg twice a day, she denies significant change of her headaches, She often wake up with bilateral frontal, temporal region pressure headaches,  Maxalt was not helpful, ibuprofen was not helpful either, she denies visual change  UPDATE Dec 14th 2015:  She had lumbar puncture December second 2015, open pressure 17 cm water  Her headache has much improved, especially since her lumbar puncture in December, she is continuing taking Topamax,  tolerating it well,   REVIEW OF SYSTEMS: Full 14 system review of systems performed and notable only for  headaches   ALLERGIES: Allergies  Allergen Reactions  . Codeine Swelling    throat  . Penicillins Swelling    Throat     HOME MEDICATIONS: Current Outpatient Prescriptions on File Prior to Visit  Medication Sig Dispense Refill  . etonogestrel-ethinyl estradiol (NUVARING) 0.12-0.015 MG/24HR vaginal ring Place 1 each vaginally every 28 (twenty-eight) days. Insert vaginally and leave in place for 3 consecutive weeks, then remove for 1 week.      . methylphenidate 27 MG PO CR tablet Take 27 mg by mouth every morning.      . Prenatal Vit-Fe Fumarate-FA (PRENATAL MULTIVITAMIN) TABS tablet Take 1 tablet by mouth daily at 12 noon.  30 tablet  12  . sertraline (ZOLOFT) 50 MG tablet Take 50 mg by mouth daily.         PAST MEDICAL HISTORY: Past Medical History  Diagnosis Date  . Infection   . UTI (lower urinary tract infection)   . Asthma   . Normal pregnancy, repeat 08/18/2013  . SVD (spontaneous vaginal delivery) 08/19/2013    PAST SURGICAL HISTORY: Past Surgical History  Procedure Laterality Date  . No past surgeries      FAMILY HISTORY: History reviewed. No pertinent family history.  SOCIAL HISTORY:  History   Social History  . Marital Status: Single    Spouse Name: N/A    Number of Children: 2, children, 612, and one year old  . Years of Education: N/A   Occupational History  .  Stay at home.   Social History Main Topics  . Smoking status: Current Every Day Smoker    Last Attempt to Quit: 10/31/2012  . Smokeless tobacco: Never Used  . Alcohol Use: No  . Drug Use: No  . Sexual Activity: Yes    Birth Control/ Protection: None   Other Topics Concern  . Not on file   Social History Narrative  . No narrative on file     PHYSICAL EXAM   Filed Vitals:   10/08/14 1344  BP: 118/70  Pulse: 62  Height: 5\' 5"  (1.651 m)  Weight: 172 lb (78.019 kg)     Not recorded      Body mass index is 28.62 kg/(m^2).   Generalized: In no acute distress  Neck: Supple, no carotid bruits   Cardiac: Regular rate rhythm  Pulmonary: Clear to auscultation bilaterally  Musculoskeletal: No deformity  Neurological examination  Mentation: Alert oriented to time, place, history taking, and causual conversation  Cranial nerve II-XII: Pupils were equal round reactive to light. Extraocular movements were full.  Visual field were full on confrontational test. Bilateral fundi showed mild blurry of bilateral optic nerve endings.  Facial sensation and strength were normal. Hearing was intact to finger rubbing bilaterally. Uvula tongue midline.  Head turning and shoulder shrug and were normal and symmetric.Tongue protrusion into cheek strength was normal.  Motor: Normal tone, bulk and strength.  Sensory: Intact to fine touch, pinprick, preserved vibratory sensation, and proprioception at toes.  Coordination: Normal finger to nose, heel-to-shin bilaterally there was no truncal ataxia  Gait: Rising up from seated position without assistance, normal stance, without trunk ataxia, moderate stride, good arm swing, smooth turning, able to perform tiptoe, and heel walking without difficulty.   Romberg signs: Negative  Deep tendon reflexes: Brachioradialis 2/2, biceps 2/2, triceps 2/2, patellar 2/2, Achilles 2/2, plantar responses were flexor bilaterally.   DIAGNOSTIC DATA (LABS, IMAGING, TESTING) - I reviewed patient records, labs, notes, testing and imaging myself where available.  Lab Results  Component Value Date   WBC 9.1 08/01/2014   HGB 13.7 08/01/2014   HCT 41.4 08/01/2014   MCV 77* 08/01/2014   PLT 270 08/01/2014   ASSESSMENT AND PLAN  Mikayla [name] is a 18 y.o. 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 Ph.D.  [facility] [address], [address] [place], [place] [address] [phone]

## 2014-10-23 LAB — FUNGUS CULTURE W SMEAR: Smear Result: NONE SEEN

## 2015-01-07 ENCOUNTER — Encounter: Payer: Self-pay | Admitting: Nurse Practitioner

## 2015-01-07 ENCOUNTER — Ambulatory Visit: Payer: Medicaid Other | Admitting: Nurse Practitioner

## 2015-01-07 ENCOUNTER — Ambulatory Visit (INDEPENDENT_AMBULATORY_CARE_PROVIDER_SITE_OTHER): Payer: Medicaid Other | Admitting: Nurse Practitioner

## 2015-01-07 VITALS — BP 123/72 | HR 86 | Ht 65.0 in | Wt 198.6 lb

## 2015-01-07 DIAGNOSIS — G932 Benign intracranial hypertension: Secondary | ICD-10-CM | POA: Diagnosis not present

## 2015-01-07 DIAGNOSIS — G43009 Migraine without aura, not intractable, without status migrainosus: Secondary | ICD-10-CM

## 2015-01-07 DIAGNOSIS — G4700 Insomnia, unspecified: Secondary | ICD-10-CM | POA: Diagnosis not present

## 2015-01-07 MED ORDER — TOPIRAMATE 100 MG PO TABS: 100.0000 mg | ORAL_TABLET | Freq: Two times a day (BID) | ORAL | Status: DC

## 2015-01-07 NOTE — Progress Notes (Signed)
[facility]  PATIENT: Mikayla [name] DOB: 11-Nov-1995   REASON FOR VISHosie SpangleT: migraines, pseudotumor cerebri, insomnia HISTORY FROM:Patient    HISTORY OF PRESENT ILLNESS: UPDATE 3/14/16Deaspnn C Mikayla [name] is a 19 yo LH female, referred by her optometrist Mikayla [name] for evaluation of possible optic nerve swelling, last seen in this office by Mikayla [name] 10/08/2014. She had her scheduled eye check in July 16 2014, because this is the time for her to get examination for new glasses, was found to have blurred bilateral optic nerve margin, her last eye check was 2013 She denies visual change, denied visual field loss, she is a mother of 8923 years old, and 19 years old,  She reported a history of headaches since age 19, her typical headaches are bilateral temporal region pressure, pounding headaches, with associated light noise sensitivity. She began to have frequent headaches since she had her second child about a year ago in 2014, to the point of daily basis, she complains of frequent episode of bitemporal region pressure headaches, with light sensitivity, no significant nausea, movements make it worse, sleep usually helps, lasting for couple hours, she has multiple recurrent episodes in a day, she has tried over-the-counter medications, such as Tylenol, ibuprofen, without helping, She denies visual change, she denied lateralized motor or sensory deficit, trigger for her headaches are stress, sleep deprivation . She also complains of excessive weight gain from 130 to 180 over past 1 year   MRI of the brain was normal, She often wake up with bilateral frontal, temporal region pressure headaches,  Maxalt was not helpful, ibuprofen was not helpful either She had lumbar puncture September 26, 2014, open pressure 17 cm water Her headache has much improved, especially since her lumbar puncture in December, return visit today she claims she was in the process of packing to move and   misplaced her Topamax. She has been off the medication for several weeks and her daily headaches have returned. She is not aware of any migraine triggers.She denies side effects to Topamax when taking the medication. She returns for reevaluation   REVIEW OF SYSTEMS: Full 14 system review of systems performed and notable only for those listed, all others are neg:  Constitutional: neg  Cardiovascular: neg Ear/Nose/Throat: neg  Skin: neg Eyes: neg Respiratory: neg Gastroitestinal: neg  Hematology/Lymphatic: neg  Endocrine: neg Musculoskeletal:neg Allergy/Immunology: neg Neurological: headache Psychiatric: neg Sleep : insomnia  ALLERGIES: Allergies  Allergen Reactions  . Codeine Swelling    throat  . Penicillins Swelling    Throat     HOME MEDICATIONS: Outpatient Prescriptions Prior to Visit  Medication Sig Dispense Refill  . butalbital-acetaminophen-caffeine (FIORICET, ESGIC) 50-325-40 MG per tablet Take 1 tablet by mouth every 6 (six) hours as needed for headache. 15 tablet 3  . methylphenidate 27 MG PO CR tablet Take 27 mg by mouth every morning.    . Prenatal Vit-Fe Fumarate-FA (PRENATAL MULTIVITAMIN) TABS tablet Take 1 tablet by mouth daily at 12 noon. 30 tablet 12  . sertraline (ZOLOFT) 100 MG tablet Take 100 mg by mouth daily.  3  . topiramate (TOPAMAX) 100 MG tablet Take 1 tablet (100 mg total) by mouth 2 (two) times daily. 60 tablet 11  . etonogestrel-ethinyl estradiol (NUVARING) 0.12-0.015 MG/24HR vaginal ring Place 1 each vaginally every 28 (twenty-eight) days. Insert vaginally and leave in place for 3 consecutive weeks, then remove for 1 week.    . Methylphenidate HCl ER 27 MG TB24 Take 1 tablet by mouth every  morning.  0  . sertraline (ZOLOFT) 50 MG tablet Take 50 mg by mouth daily.  3   No facility-administered medications prior to visit.    PAST MEDICAL HISTORY: Past Medical History  Diagnosis Date  . Infection   . UTI (lower urinary tract infection)   .  Asthma   . Normal pregnancy, repeat 08/18/2013  . SVD (spontaneous vaginal delivery) 08/19/2013    PAST SURGICAL HISTORY: Past Surgical History  Procedure Laterality Date  . No past surgeries      FAMILY HISTORY: History reviewed. No pertinent family history.  SOCIAL HISTORY: History   Social History  . Marital Status: Single    Spouse Name: N/A  . Number of Children: N/A  . Years of Education: N/A   Occupational History  . Not on file.   Social History Main Topics  . Smoking status: Current Every Day Smoker    Last Attempt to Quit: 10/31/2012  . Smokeless tobacco: Never Used  . Alcohol Use: No  . Drug Use: No  . Sexual Activity: Yes    Birth Control/ Protection: None   Other Topics Concern  . Not on file   Social History Narrative     PHYSICAL EXAM  Filed Vitals:   01/07/15 1028  BP: 123/72  Pulse: 86  Height:  (1.651 m)  Weight: 198 lb 9.6 oz (90.084 kg)   Body mass index is 33.05 kg/(m^2). Generalized: In no acute distress Neck: Supple, no carotid bruits  Cardiac: Regular rate rhythm Pulmonary: Clear to auscultation bilaterally Musculoskeletal: No deformity  Neurological examination  Mentation: Alert oriented to time, place, history taking, and causual conversation Cranial nerve II-XII: Visual acuity 20/40 bilaterallyPupils were equal round reactive to light. Extraocular movements were full. Visual field were full on confrontational test. Bilateral fundi showed mild blurry of bilateral optic nerve endings. Facial sensation and strength were normal. Hearing was intact to finger rubbing bilaterally. Uvula tongue midline. Head turning and shoulder shrug and were normal and symmetric.Tongue protrusion into cheek strength was normal. Motor: Normal tone, bulk and strength.no focal weakness Sensory: Intact to fine touch, pinprick, preserved vibratory sensation, and proprioception at toes. Coordination: Normal finger to nose, heel-to-shin  bilaterally there was no truncal ataxia Gait: Rising up from seated position without assistance, normal stance, without trunk ataxia, moderate stride, good arm swing, smooth turning, able to perform tiptoe, and heel walking without difficulty.  Deep tendon reflexes: Brachioradialis 2/2, biceps 2/2, triceps 2/2, patellar 2/2, Achilles 2/2, plantar responses were flexor bilaterally.  DIAGNOSTIC DATA (LABS, IMAGING, TESTING) - I reviewed patient records, labs, notes, testing and imaging myself where available.  Lab Results  Component Value Date   WBC 9.1 08/01/2014   HGB 13.7 08/01/2014   HCT 41.4 08/01/2014   MCV 77* 08/01/2014   PLT 270 08/01/2014      Component Value Date/Time   NA 139 08/01/2014 1235   K 4.3 08/01/2014 1235   CL 101 08/01/2014 1235   CO2 23 08/01/2014 1235   GLUCOSE 82 08/01/2014 1235   BUN 8 08/01/2014 1235   CREATININE 0.92 08/01/2014 1235   CALCIUM 9.4 08/01/2014 1235   PROT 7.1 08/01/2014 1235   AST 15 08/01/2014 1235   ALT 7 08/01/2014 1235   ALKPHOS 109 08/01/2014 1235   BILITOT 0.2 08/01/2014 1235   GFRNONAA 90 08/01/2014 1235   GFRAA 104 08/01/2014 1235    Lab Results  Component Value Date   VITAMINB12 626 08/01/2014   Lab Results  Component  Value Date   TSH 1.350 08/01/2014      ASSESSMENT AND PLAN  19 y.o. year old female  has a past medical history of  Asthma; migraines, pseudotumor cerebri and insomnia. Patient reports she has been off her Topamax for approximately 2 weeks due to a move. She is not aware of any migraine triggers  Continue Topamax at current dose will refill, she was made aware that Topamax is not a drug  she would want to take if she is pregnant again, important not to stop the drug as it is much more effective taken on a daily basis as ordered and it may take her several days before her headaches go away since she has been off the medication. Will repeat CMP next visit, reviewed previous a side effect to the Topamax.    Reviewed with patient  a list of foods that can cause headache, she was made aware to eliminate one food at a time Fioricet when necessary She can take over-the-counter melatonin for insomnia see if that would be beneficial.  Follow-up in 6 months. Nilda Riggs, [facility], [facility], APRN  [facility] [address], [address] [place], [place] [address] [phone]

## 2015-01-07 NOTE — Patient Instructions (Addendum)
Continue Topamax at current dose will refill Given a list of foods that can cause headache Fioricet when necessary Follow-up in 6 months.

## 2015-01-08 NOTE — Progress Notes (Signed)
I have reviewed and agreed above plan. 

## 2015-05-29 ENCOUNTER — Encounter: Payer: Self-pay | Admitting: Nurse Practitioner

## 2015-06-18 LAB — OB RESULTS CONSOLE HEPATITIS B SURFACE ANTIGEN: Hepatitis B Surface Ag: NEGATIVE

## 2015-06-18 LAB — OB RESULTS CONSOLE RPR: RPR: NONREACTIVE

## 2015-06-18 LAB — OB RESULTS CONSOLE ABO/RH: RH TYPE: POSITIVE

## 2015-06-18 LAB — OB RESULTS CONSOLE HIV ANTIBODY (ROUTINE TESTING): HIV: NONREACTIVE

## 2015-06-18 LAB — OB RESULTS CONSOLE ANTIBODY SCREEN: ANTIBODY SCREEN: NEGATIVE

## 2015-06-18 LAB — OB RESULTS CONSOLE RUBELLA ANTIBODY, IGM: Rubella: IMMUNE

## 2015-06-18 LAB — OB RESULTS CONSOLE GC/CHLAMYDIA
Chlamydia: NEGATIVE
GC PROBE AMP, GENITAL: NEGATIVE

## 2015-07-04 ENCOUNTER — Inpatient Hospital Stay (HOSPITAL_COMMUNITY)
Admission: AD | Admit: 2015-07-04 | Discharge: 2015-07-05 | Disposition: A | Discharge status: Home / Self Care | Payer: Medicaid Other | Source: Ambulatory Visit | Attending: Obstetrics and Gynecology | Admitting: Obstetrics and Gynecology

## 2015-07-04 ENCOUNTER — Encounter (HOSPITAL_COMMUNITY): Payer: Self-pay | Admitting: *Deleted

## 2015-07-04 DIAGNOSIS — O99331 Smoking (tobacco) complicating pregnancy, first trimester: Secondary | ICD-10-CM | POA: Insufficient documentation

## 2015-07-04 DIAGNOSIS — O209 Hemorrhage in early pregnancy, unspecified: Secondary | ICD-10-CM | POA: Insufficient documentation

## 2015-07-04 DIAGNOSIS — Z880 Allergy status to penicillin: Secondary | ICD-10-CM | POA: Insufficient documentation

## 2015-07-04 DIAGNOSIS — F17210 Nicotine dependence, cigarettes, uncomplicated: Secondary | ICD-10-CM | POA: Insufficient documentation

## 2015-07-04 DIAGNOSIS — Z3A12 12 weeks gestation of pregnancy: Secondary | ICD-10-CM | POA: Insufficient documentation

## 2015-07-04 NOTE — MAU Note (Signed)
PT SAYS SHE STARTED   VAG BLEEDING  AT 1045PM-      WHILE  LAYING ON BED.  LAST SEX- MON.     U/S IN OFFICE  SAYS SHE HAD  CYST ON  OVARY-    BUT  ALL  WITH   BABY  GOOD.       IN TRIAGE    PANTYLINER -     NOTHING  .       SOMETIMES  HAS  CRAMPING.

## 2015-07-04 NOTE — Discharge Instructions (Signed)
Pelvic Rest °Pelvic rest is sometimes recommended for women when:  °· The placenta is partially or completely covering the opening of the cervix (placenta previa). °· There is bleeding between the uterine wall and the amniotic sac in the first trimester (subchorionic hemorrhage). °· The cervix begins to open without labor starting (incompetent cervix, cervical insufficiency). °· The labor is too early (preterm labor). °HOME CARE INSTRUCTIONS °· Do not have sexual intercourse, stimulation, or an orgasm. °· Do not use tampons, douche, or put anything in the vagina. °· Do not lift anything over 10 pounds (4.5 kg). °· Avoid strenuous activity or straining your pelvic muscles. °SEEK MEDICAL CARE IF:  °· You have any vaginal bleeding during pregnancy. Treat this as a potential emergency. °· You have cramping pain felt low in the stomach (stronger than menstrual cramps). °· You notice vaginal discharge (watery, mucus, or bloody). °· You have a low, dull backache. °· There are regular contractions or uterine tightening. °SEEK IMMEDIATE MEDICAL CARE IF: °You have vaginal bleeding and have placenta previa.  °Document Released: 02/06/2011 Document Revised: 01/04/2012 Document Reviewed: 02/06/2011 °ExitCare® Patient Information ©2015 [employer]. This information is not intended to replace advice given to you by your health care provider. Make sure you discuss any questions you have with your health care provider. ° °Vaginal Bleeding During Pregnancy, First Trimester °A small amount of bleeding (spotting) from the vagina is common in early pregnancy. Sometimes the bleeding is normal and is not a problem, and sometimes it is a sign of something serious. Be sure to tell your doctor about any bleeding from your vagina right away. °HOME CARE °· Watch your condition for any changes. °· Follow your doctor's instructions about how active you can be. °· If you are on bed rest: °¨ You may need to stay in bed and only get up to use the  bathroom. °¨ You may be allowed to do some activities. °¨ If you need help, make plans for someone to help you. °· Write down: °¨ The number of pads you use each day. °¨ How often you change pads. °¨ How soaked (saturated) your pads are. °· Do not use tampons. °· Do not douche. °· Do not have sex or orgasms until your doctor says it is okay. °· If you pass any tissue from your vagina, save the tissue so you can show it to your doctor. °· Only take medicines as told by your doctor. °· Do not take aspirin because it can make you bleed. °· Keep all follow-up visits as told by your doctor. °GET HELP IF:  °· You bleed from your vagina. °· You have cramps. °· You have labor pains. °· You have a fever that does not go away after you take medicine. °GET HELP RIGHT AWAY IF:  °· You have very bad cramps in your back or belly (abdomen). °· You pass large clots or tissue from your vagina. °· You bleed more. °· You feel light-headed or weak. °· You pass out (faint). °· You have chills. °· You are leaking fluid or have a gush of fluid from your vagina. °· You pass out while pooping (having a bowel movement). °MAKE SURE YOU: °· Understand these instructions. °· Will watch your condition. °· Will get help right away if you are not doing well or get worse. °Document Released: 02/26/2014 Document Reviewed: 06/19/2013 °ExitCare® Patient Information ©2015 [employer]. This information is not intended to replace advice given to you by your health care provider. Make   sure you discuss any questions you have with your health care provider. ° °

## 2015-07-04 NOTE — MAU Provider Note (Signed)
History     CSN: [id number]  Arrival date and time: 07/04/15 2317   First Provider Initiated Contact with Patient 07/04/15 2340      No chief complaint on file.  HPI [name] is a 19 y.o. G3P2002 at [redacted]w[redacted]d who presents to MAU today with complaint of vaginal bleeding since 1030 this morning. She states that bleeding is lighter now than at onset. She has required 1 panty liner all day. She denies pain, UTI symptoms fever, diarrhea, constipation or vomiting. She has had mild intermittent nausea. She states last intercourse was Monday.   OB History    Gravida Para Term Preterm AB TAB SAB Ectopic Multiple Living   3 2 2       2       Past Medical History  Diagnosis Date  . Infection   . UTI (lower urinary tract infection)   . Asthma   . Normal pregnancy, repeat 08/18/2013  . SVD (spontaneous vaginal delivery) 08/19/2013    Past Surgical History  Procedure Laterality Date  . No past surgeries      History reviewed. No pertinent family history.  Social History  Substance Use Topics  . Smoking status: Current Every Day Smoker    Last Attempt to Quit: 10/31/2012  . Smokeless tobacco: Never Used  . Alcohol Use: No    Allergies:  Allergies  Allergen Reactions  . Codeine Swelling    throat  . Penicillins Swelling    Throat     Prescriptions prior to admission  Medication Sig Dispense Refill Last Dose  . butalbital-acetaminophen-caffeine (FIORICET, ESGIC) 50-325-40 MG per tablet Take 1 tablet by mouth every 6 (six) hours as needed for headache. 15 tablet 3 Taking  . etonogestrel-ethinyl estradiol (NUVARING) 0.12-0.015 MG/24HR vaginal ring Place 1 each vaginally every 28 (twenty-eight) days. Insert vaginally and leave in place for 3 consecutive weeks, then remove for 1 week.   Not Taking  . methylphenidate 27 MG PO CR tablet Take 27 mg by mouth every morning.   Taking  . Prenatal Vit-Fe Fumarate-FA (PRENATAL MULTIVITAMIN) TABS tablet Take 1 tablet by mouth daily  at 12 noon. 30 tablet 12 Taking  . sertraline (ZOLOFT) 100 MG tablet Take 100 mg by mouth daily.  3 Taking  . topiramate (TOPAMAX) 100 MG tablet Take 1 tablet (100 mg total) by mouth 2 (two) times daily. 60 tablet 11     Review of Systems  Constitutional: Negative for fever and malaise/fatigue.  Gastrointestinal: Positive for nausea. Negative for vomiting, abdominal pain, diarrhea and constipation.  Genitourinary: Negative for dysuria, urgency and frequency.       + vaginal bleeding   Physical Exam   Blood pressure 112/57, pulse 106, temperature 98.2 F (36.8 C), temperature source Oral, resp. rate 20, height 5\' 4"  (1.626 m), weight 191 lb 2 oz (86.694 kg), last menstrual period 09/12/2014, unknown if currently breastfeeding.  Physical Exam  Nursing note and vitals reviewed. Constitutional: She is oriented to person, place, and time. She appears well-developed and well-nourished. No distress.  HENT:  Head: Normocephalic and atraumatic.  Cardiovascular: Normal rate.   Respiratory: Effort normal.  GI: Soft. She exhibits no distension and no mass. There is no tenderness. There is no rebound and no guarding.  Genitourinary: There is bleeding (small amount of dark blood in the vaginal vault) in the vagina. No vaginal discharge found.  Neurological: She is alert and oriented to person, place, and time.  Skin: Skin is warm and dry. No  erythema.  Psychiatric: She has a normal mood and affect.  Dilation: Closed Effacement (%): Thick Cervical Position: Posterior Exam by:: Harlon Flor, PA-C  Results for orders placed or performed during the hospital encounter of 07/04/15 (from the past 24 hour(s))  CBC with Differential/Platelet     Status: Abnormal   Collection Time: 07/04/15 11:40 PM  Result Value Ref Range   WBC 8.2 4.0 - 10.5 K/uL   RBC 4.77 3.87 - 5.11 MIL/uL   Hemoglobin 12.4 12.0 - 15.0 g/dL   HCT 16.1 09.6 - 04.5 %   MCV 76.1 (L) 78.0 - 100.0 fL   MCH 26.0 26.0 - 34.0 pg   MCHC  34.2 30.0 - 36.0 g/dL   RDW 40.9 81.1 - 91.4 %   Platelets 237 150 - 400 K/uL   Neutrophils Relative % 60 43 - 77 %   Neutro Abs 4.9 1.7 - 7.7 K/uL   Lymphocytes Relative 32 12 - 46 %   Lymphs Abs 2.6 0.7 - 4.0 K/uL   Monocytes Relative 7 3 - 12 %   Monocytes Absolute 0.6 0.1 - 1.0 K/uL   Eosinophils Relative 1 0 - 5 %   Eosinophils Absolute 0.1 0.0 - 0.7 K/uL   Basophils Relative 0 0 - 1 %   Basophils Absolute 0.0 0.0 - 0.1 K/uL    MAU Course  Procedures None  MDM Per Dr. Ellyn Hack report - no [facility] noted on [place] on 06/18/15 FHR - 156 bpm with doppler Discussed patient with Dr. Ellyn Hack. Agrees with plan for discharge with pelvic rest and bleeding precautions   Assessment and Plan  A: SIUP at [redacted]w[redacted]d Vaginal bleeding in pregnancy  P: Discharge home Bleeding precautions discussed Pelvic rest advised Patient advised to follow-up with Dr. Ellyn Hack as scheduled for routine prenatal care Patient may return to MAU as needed or if her condition were to change or worsen   Marny Lowenstein, PA-C  07/05/2015, 12:04 AM

## 2015-07-05 LAB — CBC WITH DIFFERENTIAL/PLATELET
Basophils Absolute: 0 10*3/uL (ref 0.0–0.1)
Basophils Relative: 0 % (ref 0–1)
EOS ABS: 0.1 10*3/uL (ref 0.0–0.7)
Eosinophils Relative: 1 % (ref 0–5)
HCT: 36.3 % (ref 36.0–46.0)
HEMOGLOBIN: 12.4 g/dL (ref 12.0–15.0)
LYMPHS ABS: 2.6 10*3/uL (ref 0.7–4.0)
Lymphocytes Relative: 32 % (ref 12–46)
MCH: 26 pg (ref 26.0–34.0)
MCHC: 34.2 g/dL (ref 30.0–36.0)
MCV: 76.1 fL — ABNORMAL LOW (ref 78.0–100.0)
Monocytes Absolute: 0.6 10*3/uL (ref 0.1–1.0)
Monocytes Relative: 7 % (ref 3–12)
NEUTROS PCT: 60 % (ref 43–77)
Neutro Abs: 4.9 10*3/uL (ref 1.7–7.7)
Platelets: 237 10*3/uL (ref 150–400)
RBC: 4.77 MIL/uL (ref 3.87–5.11)
RDW: 13.2 % (ref 11.5–15.5)
WBC: 8.2 10*3/uL (ref 4.0–10.5)

## 2015-07-12 ENCOUNTER — Ambulatory Visit: Payer: Medicaid Other | Admitting: Nurse Practitioner

## 2015-10-27 NOTE — L&D Delivery Note (Signed)
Delivery Note Pt progressed rapidly to C/C/+1. With pelvic pressure.  With 2 pushes for SVD.  At 1:54 PM a viable and healthy female was delivered via  (Presentation: OA to ROA ).  APGAR: 9,9 ; weight P .   Placenta status: delivered, intact .  Cord:  3VC with the following complications:none.   Anesthesia: epidural    Episiotomy:  none Lacerations:  None - abrasions - hemostatic Suture Repair: N/A Est. Blood Loss (mL): 100cc   Mom to postpartum.  Baby to Couplet care / Skin to Skin.  [name]-Stuckert, Mikayla [name] 01/13/2016, 2:21 PM  A+/RI/Tdap in PNC/Contra?/Br

## 2015-12-15 LAB — OB RESULTS CONSOLE GBS: GBS: NEGATIVE

## 2015-12-19 ENCOUNTER — Encounter (HOSPITAL_COMMUNITY): Payer: Self-pay | Admitting: *Deleted

## 2015-12-19 ENCOUNTER — Inpatient Hospital Stay (HOSPITAL_COMMUNITY)
Admission: AD | Admit: 2015-12-19 | Discharge: 2015-12-19 | Disposition: A | Discharge status: Home / Self Care | Payer: Medicaid Other | Source: Ambulatory Visit | Attending: Obstetrics and Gynecology | Admitting: Obstetrics and Gynecology

## 2015-12-19 DIAGNOSIS — M549 Dorsalgia, unspecified: Secondary | ICD-10-CM | POA: Diagnosis not present

## 2015-12-19 DIAGNOSIS — O479 False labor, unspecified: Secondary | ICD-10-CM

## 2015-12-19 DIAGNOSIS — O99333 Smoking (tobacco) complicating pregnancy, third trimester: Secondary | ICD-10-CM | POA: Diagnosis not present

## 2015-12-19 DIAGNOSIS — O4703 False labor before 37 completed weeks of gestation, third trimester: Secondary | ICD-10-CM

## 2015-12-19 DIAGNOSIS — F17210 Nicotine dependence, cigarettes, uncomplicated: Secondary | ICD-10-CM | POA: Diagnosis not present

## 2015-12-19 DIAGNOSIS — Z880 Allergy status to penicillin: Secondary | ICD-10-CM | POA: Diagnosis not present

## 2015-12-19 DIAGNOSIS — O26893 Other specified pregnancy related conditions, third trimester: Secondary | ICD-10-CM | POA: Diagnosis not present

## 2015-12-19 DIAGNOSIS — Z3A36 36 weeks gestation of pregnancy: Secondary | ICD-10-CM | POA: Diagnosis not present

## 2015-12-19 DIAGNOSIS — R102 Pelvic and perineal pain: Secondary | ICD-10-CM | POA: Diagnosis present

## 2015-12-19 LAB — WET PREP, GENITAL
Clue Cells Wet Prep HPF POC: NONE SEEN
SPERM: NONE SEEN
TRICH WET PREP: NONE SEEN
YEAST WET PREP: NONE SEEN

## 2015-12-19 LAB — URINE MICROSCOPIC-ADD ON: RBC / HPF: NONE SEEN RBC/hpf (ref 0–5)

## 2015-12-19 LAB — URINALYSIS, ROUTINE W REFLEX MICROSCOPIC
Bilirubin Urine: NEGATIVE
Glucose, UA: NEGATIVE mg/dL
Hgb urine dipstick: NEGATIVE
Ketones, ur: 15 mg/dL — AB
Nitrite: NEGATIVE
PH: 6 (ref 5.0–8.0)
Protein, ur: NEGATIVE mg/dL
Specific Gravity, Urine: 1.025 (ref 1.005–1.030)

## 2015-12-19 NOTE — MAU Note (Signed)
Around 4 or 5pm started having pain and pressure in her vagina, back, and lower abdomen. No bleeding or leaking

## 2015-12-19 NOTE — Discharge Instructions (Signed)
Braxton Hicks Contractions °Contractions of the uterus can occur throughout pregnancy. Contractions are not always a sign that you are in labor.  °WHAT ARE BRAXTON HICKS CONTRACTIONS?  °Contractions that occur before labor are called Braxton Hicks contractions, or false labor. Toward the end of pregnancy (32-34 weeks), these contractions can develop more often and may become more forceful. This is not true labor because these contractions do not result in opening (dilatation) and thinning of the cervix. They are sometimes difficult to tell apart from true labor because these contractions can be forceful and people have different pain tolerances. You should not feel embarrassed if you go to the hospital with false labor. Sometimes, the only way to tell if you are in true labor is for your health care provider to look for changes in the cervix. °If there are no prenatal problems or other health problems associated with the pregnancy, it is completely safe to be sent home with false labor and await the onset of true labor. °HOW CAN YOU TELL THE DIFFERENCE BETWEEN TRUE AND FALSE LABOR? °False Labor °· The contractions of false labor are usually shorter and not as hard as those of true labor.   °· The contractions are usually irregular.   °· The contractions are often felt in the front of the lower abdomen and in the groin.   °· The contractions may go away when you walk around or change positions while lying down.   °· The contractions get weaker and are shorter lasting as time goes on.   °· The contractions do not usually become progressively stronger, regular, and closer together as with true labor.   °True Labor °· Contractions in true labor last 30-70 seconds, become very regular, usually become more intense, and increase in frequency.   °· The contractions do not go away with walking.   °· The discomfort is usually felt in the top of the uterus and spreads to the lower abdomen and low back.   °· True labor can be  determined by your health care provider with an exam. This will show that the cervix is dilating and getting thinner.   °WHAT TO REMEMBER °· Keep up with your usual exercises and follow other instructions given by your health care provider.   °· Take medicines as directed by your health care provider.   °· Keep your regular prenatal appointments.   °· Eat and drink lightly if you think you are going into labor.   °· If Braxton Hicks contractions are making you uncomfortable:   °¨ Change your position from lying down or resting to walking, or from walking to resting.   °¨ Sit and rest in a tub of warm water.   °¨ Drink 2-3 glasses of water. Dehydration may cause these contractions.   °¨ Do slow and deep breathing several times an hour.   °WHEN SHOULD I SEEK IMMEDIATE MEDICAL CARE? °Seek immediate medical care if: °· Your contractions become stronger, more regular, and closer together.   °· You have fluid leaking or gushing from your vagina.   °· You have a fever.   °· You pass blood-tinged mucus.   °· You have vaginal bleeding.   °· You have continuous abdominal pain.   °· You have low back pain that you never had before.   °· You feel your baby's head pushing down and causing pelvic pressure.   °· Your baby is not moving as much as it used to.   °  °This information is not intended to replace advice given to you by your health care provider. Make sure you discuss any questions you have with your health care   provider. °  °Document Released: 10/12/2005 Document Revised: 10/17/2013 Document Reviewed: 07/24/2013 °Elsevier Interactive Patient Education ©2016 [employer]. ° °

## 2015-12-19 NOTE — MAU Provider Note (Signed)
History     CSN: [id number]  Arrival date and time: 12/19/15 2037   First Provider Initiated Contact with Patient 12/19/15 2118      Chief Complaint  Patient presents with  . Abdominal Pain   HPI [name] is a 20 y.o. G3P2002 at [redacted]w[redacted]d who presents to MAU today with complaint of vaginal pain that radiates through the lower abdomen into the low back. She also endorses lower abdominal and vaginal pressure. She has not taken anything for pain. She denies bleeding or LOF or regular contractions. She also denies complications with the pregnancy. She reports good fetal movement.   OB History    Gravida Para Term Preterm AB TAB SAB Ectopic Multiple Living   Past Medical History  Diagnosis Date  . Infection   . UTI (lower urinary tract infection)   . Asthma   . Normal pregnancy, repeat 08/18/2013  . SVD (spontaneous vaginal delivery) 08/19/2013    Past Surgical History  Procedure Laterality Date  . No past surgeries      History reviewed. No pertinent family history.  Social History  Substance Use Topics  . Smoking status: Current Every Day Smoker    Last Attempt to Quit: 10/31/2012  . Smokeless tobacco: Never Used  . Alcohol Use: No    Allergies:  Allergies  Allergen Reactions  . Codeine Anaphylaxis and Swelling    throat  . Penicillins Swelling    Has patient had a PCN reaction causing immediate rash, facial/tongue/throat swelling, SOB or lightheadedness with hypotension: Yes Has patient had a PCN reaction causing severe rash involving mucus membranes or skin necrosis: No Has patient had a PCN reaction that required hospitalization No Has patient had a PCN reaction occurring within the last 10 years: Yes If all of the above answers are "NO", then may proceed with Cephalosporin use.     No prescriptions prior to admission    Review of Systems  Constitutional: Negative for fever and malaise/fatigue.  Gastrointestinal: Positive  for abdominal pain. Negative for nausea, vomiting, diarrhea and constipation.  Genitourinary: Negative for dysuria, urgency and frequency.       Neg - vaginal bleeding, discharge, LOF  Musculoskeletal: Positive for back pain.   Physical Exam   Blood pressure 118/63, pulse 91, temperature 98.1 F (36.7 C), temperature source Oral, resp. rate 18, height  (1.651 m), weight 200 lb (90.719 kg), last menstrual period 09/12/2014, unknown if currently breastfeeding.  Physical Exam  Nursing note and vitals reviewed. Constitutional: She is oriented to person, place, and time. She appears well-developed and well-nourished. No distress.  HENT:  Head: Normocephalic and atraumatic.  Cardiovascular: Normal rate.   Respiratory: Effort normal.  GI: Soft. She exhibits no distension and no mass. There is no tenderness. There is no rebound and no guarding.  Genitourinary: Uterus is enlarged. Cervix exhibits no motion tenderness, no discharge and no friability. No bleeding in the vagina. Vaginal discharge (small amount of thin, white-yellow discharge noted) found.  Neurological: She is alert and oriented to person, place, and time.  Skin: Skin is warm and dry. No erythema.  Psychiatric: She has a normal mood and affect.  Dilation: Fingertip Effacement (%): Thick Cervical Position: Posterior Exam by:: Harlon Flor, PA-C   Results for orders placed or performed during the hospital encounter of 12/19/15 (from the past 24 hour(s))  Urinalysis, Routine w reflex microscopic (not at [facility])  Status: Abnormal   Collection Time: 12/19/15  8:55 PM  Result Value Ref Range   Color, Urine YELLOW YELLOW   APPearance CLEAR CLEAR   Specific Gravity, Urine 1.025 1.005 - 1.030   pH 6.0 5.0 - 8.0   Glucose, UA NEGATIVE NEGATIVE mg/dL   Hgb urine dipstick NEGATIVE NEGATIVE   Bilirubin Urine NEGATIVE NEGATIVE   Ketones, ur 15 (A) NEGATIVE mg/dL   Protein, ur NEGATIVE NEGATIVE mg/dL   Nitrite NEGATIVE NEGATIVE    Leukocytes, UA MODERATE (A) NEGATIVE  Urine microscopic-add on     Status: Abnormal   Collection Time: 12/19/15  8:55 PM  Result Value Ref Range   Squamous Epithelial / LPF 6-30 (A) NONE SEEN   WBC, UA 6-30 0 - 5 WBC/hpf   RBC / HPF NONE SEEN 0 - 5 RBC/hpf   Bacteria, UA MANY (A) NONE SEEN   Urine-Other MUCOUS PRESENT   Wet prep, genital     Status: Abnormal   Collection Time: 12/19/15  9:39 PM  Result Value Ref Range   Yeast Wet Prep HPF POC NONE SEEN NONE SEEN   Trich, Wet Prep NONE SEEN NONE SEEN   Clue Cells Wet Prep HPF POC NONE SEEN NONE SEEN   WBC, Wet Prep HPF POC MANY (A) NONE SEEN   Sperm NONE SEEN     MAU Course  Procedures None  MDM UA today Wet prep today Urine culture pending Discussed patient with Dr. Mindi Slicker  Assessment and Plan  A: SIUP at [redacted]w[redacted]d Back pain in pregnancy Abdominal pain in pregnancy  P: Discharge home Labor precautions discussed Patient advised to follow-up with [facility] [place] as scheduled for routine prenatal care Patient may return to MAU as needed or if her condition were to change or worsen   Marny Lowenstein, PA-C  12/19/2015, 9:56 PM

## 2015-12-21 LAB — CULTURE, OB URINE

## 2016-01-03 ENCOUNTER — Encounter (HOSPITAL_COMMUNITY): Payer: Self-pay

## 2016-01-03 ENCOUNTER — Inpatient Hospital Stay (HOSPITAL_COMMUNITY)
Admission: AD | Admit: 2016-01-03 | Discharge: 2016-01-03 | Disposition: A | Discharge status: Home / Self Care | Payer: Medicaid Other | Source: Ambulatory Visit | Attending: Obstetrics and Gynecology | Admitting: Obstetrics and Gynecology

## 2016-01-03 ENCOUNTER — Inpatient Hospital Stay (HOSPITAL_COMMUNITY): Payer: Medicaid Other

## 2016-01-03 DIAGNOSIS — IMO0002 Reserved for concepts with insufficient information to code with codable children: Secondary | ICD-10-CM

## 2016-01-03 DIAGNOSIS — Z3493 Encounter for supervision of normal pregnancy, unspecified, third trimester: Secondary | ICD-10-CM | POA: Insufficient documentation

## 2016-01-03 NOTE — Discharge Instructions (Signed)
Braxton Hicks Contractions °Contractions of the uterus can occur throughout pregnancy. Contractions are not always a sign that you are in labor.  °WHAT ARE BRAXTON HICKS CONTRACTIONS?  °Contractions that occur before labor are called Braxton Hicks contractions, or false labor. Toward the end of pregnancy (32-34 weeks), these contractions can develop more often and may become more forceful. This is not true labor because these contractions do not result in opening (dilatation) and thinning of the cervix. They are sometimes difficult to tell apart from true labor because these contractions can be forceful and people have different pain tolerances. You should not feel embarrassed if you go to the hospital with false labor. Sometimes, the only way to tell if you are in true labor is for your health care provider to look for changes in the cervix. °If there are no prenatal problems or other health problems associated with the pregnancy, it is completely safe to be sent home with false labor and await the onset of true labor. °HOW CAN YOU TELL THE DIFFERENCE BETWEEN TRUE AND FALSE LABOR? °False Labor °· The contractions of false labor are usually shorter and not as hard as those of true labor.   °· The contractions are usually irregular.   °· The contractions are often felt in the front of the lower abdomen and in the groin.   °· The contractions may go away when you walk around or change positions while lying down.   °· The contractions get weaker and are shorter lasting as time goes on.   °· The contractions do not usually become progressively stronger, regular, and closer together as with true labor.   °True Labor °1. Contractions in true labor last 30-70 seconds, become very regular, usually become more intense, and increase in frequency.   °2. The contractions do not go away with walking.   °3. The discomfort is usually felt in the top of the uterus and spreads to the lower abdomen and low back.   °4. True labor can  be determined by your health care provider with an exam. This will show that the cervix is dilating and getting thinner.   °WHAT TO REMEMBER °· Keep up with your usual exercises and follow other instructions given by your health care provider.   °· Take medicines as directed by your health care provider.   °· Keep your regular prenatal appointments.   °· Eat and drink lightly if you think you are going into labor.   °· If Braxton Hicks contractions are making you uncomfortable:   °· Change your position from lying down or resting to walking, or from walking to resting.   °· Sit and rest in a tub of warm water.   °· Drink 2-3 glasses of water. Dehydration may cause these contractions.   °· Do slow and deep breathing several times an hour.   °WHEN SHOULD I SEEK IMMEDIATE MEDICAL CARE? °Seek immediate medical care if: °· Your contractions become stronger, more regular, and closer together.   °· You have fluid leaking or gushing from your vagina.   °· You have a fever.   °· You pass blood-tinged mucus.   °· You have vaginal bleeding.   °· You have continuous abdominal pain.   °· You have low back pain that you never had before.   °· You feel your baby's head pushing down and causing pelvic pressure.   °· Your baby is not moving as much as it used to.   °  °This information is not intended to replace advice given to you by your health care provider. Make sure you discuss any questions you have with your health care   provider. °  °Document Released: 10/12/2005 Document Revised: 10/17/2013 Document Reviewed: 07/24/2013 °Elsevier Interactive Patient Education ©2016 [employer] ° °Fetal Movement Counts °Patient Name: __________________________________________________ Patient Due Date: ____________________ °Performing a fetal movement count is highly recommended in high-risk pregnancies, but it is good for every pregnant woman to do. Your health care provider may ask you to start counting fetal movements at 28 weeks of the  pregnancy. Fetal movements often increase: °· After eating a full meal. °· After physical activity. °· After eating or drinking something sweet or cold. °· At rest. °Pay attention to when you feel the baby is most active. This will help you notice a pattern of your baby's sleep and wake cycles and what factors contribute to an increase in fetal movement. It is important to perform a fetal movement count at the same time each day when your baby is normally most active.  °HOW TO COUNT FETAL MOVEMENTS °5. Find a quiet and comfortable area to sit or lie down on your left side. Lying on your left side provides the best blood and oxygen circulation to your baby. °6. Write down the day and time on a sheet of paper or in a journal. °7. Start counting kicks, flutters, swishes, rolls, or jabs in a 2-hour period. You should feel at least 10 movements within 2 hours. °8. If you do not feel 10 movements in 2 hours, wait 2-3 hours and count again. Look for a change in the pattern or not enough counts in 2 hours. °SEEK MEDICAL CARE IF: °· You feel less than 10 counts in 2 hours, tried twice. °· There is no movement in over an hour. °· The pattern is changing or taking longer each day to reach 10 counts in 2 hours. °· You feel the baby is not moving as he or she usually does. °Date: ____________ Movements: ____________ Start time: ____________ Finish time: ____________  °Date: ____________ Movements: ____________ Start time: ____________ Finish time: ____________ °Date: ____________ Movements: ____________ Start time: ____________ Finish time: ____________ °Date: ____________ Movements: ____________ Start time: ____________ Finish time: ____________ °Date: ____________ Movements: ____________ Start time: ____________ Finish time: ____________ °Date: ____________ Movements: ____________ Start time: ____________ Finish time: ____________ °Date: ____________ Movements: ____________ Start time: ____________ Finish time:  ____________ °Date: ____________ Movements: ____________ Start time: ____________ Finish time: ____________  °Date: ____________ Movements: ____________ Start time: ____________ Finish time: ____________ °Date: ____________ Movements: ____________ Start time: ____________ Finish time: ____________ °Date: ____________ Movements: ____________ Start time: ____________ Finish time: ____________ °Date: ____________ Movements: ____________ Start time: ____________ Finish time: ____________ °Date: ____________ Movements: ____________ Start time: ____________ Finish time: ____________ °Date: ____________ Movements: ____________ Start time: ____________ Finish time: ____________ °Date: ____________ Movements: ____________ Start time: ____________ Finish time: ____________  °Date: ____________ Movements: ____________ Start time: ____________ Finish time: ____________ °Date: ____________ Movements: ____________ Start time: ____________ Finish time: ____________ °Date: ____________ Movements: ____________ Start time: ____________ Finish time: ____________ °Date: ____________ Movements: ____________ Start time: ____________ Finish time: ____________ °Date: ____________ Movements: ____________ Start time: ____________ Finish time: ____________ °Date: ____________ Movements: ____________ Start time: ____________ Finish time: ____________ °Date: ____________ Movements: ____________ Start time: ____________ Finish time: ____________  °Date: ____________ Movements: ____________ Start time: ____________ Finish time: ____________ °Date: ____________ Movements: ____________ Start time: ____________ Finish time: ____________ °Date: ____________ Movements: ____________ Start time: ____________ Finish time: ____________ °Date: ____________ Movements: ____________ Start time: ____________ Finish time: ____________ °Date: ____________ Movements: ____________ Start time: ____________ Finish time: ____________ °Date: ____________ Movements:  ____________ Start time: ____________ Finish   time: ____________ °Date: ____________ Movements: ____________ Start time: ____________ Finish time: ____________  °Date: ____________ Movements: ____________ Start time: ____________ Finish time: ____________ °Date: ____________ Movements: ____________ Start time: ____________ Finish time: ____________ °Date: ____________ Movements: ____________ Start time: ____________ Finish time: ____________ °Date: ____________ Movements: ____________ Start time: ____________ Finish time: ____________ °Date: ____________ Movements: ____________ Start time: ____________ Finish time: ____________ °Date: ____________ Movements: ____________ Start time: ____________ Finish time: ____________ °Date: ____________ Movements: ____________ Start time: ____________ Finish time: ____________  °Date: ____________ Movements: ____________ Start time: ____________ Finish time: ____________ °Date: ____________ Movements: ____________ Start time: ____________ Finish time: ____________ °Date: ____________ Movements: ____________ Start time: ____________ Finish time: ____________ °Date: ____________ Movements: ____________ Start time: ____________ Finish time: ____________ °Date: ____________ Movements: ____________ Start time: ____________ Finish time: ____________ °Date: ____________ Movements: ____________ Start time: ____________ Finish time: ____________ °Date: ____________ Movements: ____________ Start time: ____________ Finish time: ____________  °Date: ____________ Movements: ____________ Start time: ____________ Finish time: ____________ °Date: ____________ Movements: ____________ Start time: ____________ Finish time: ____________ °Date: ____________ Movements: ____________ Start time: ____________ Finish time: ____________ °Date: ____________ Movements: ____________ Start time: ____________ Finish time: ____________ °Date: ____________ Movements: ____________ Start time: ____________ Finish  time: ____________ °Date: ____________ Movements: ____________ Start time: ____________ Finish time: ____________ °Date: ____________ Movements: ____________ Start time: ____________ Finish time: ____________  °Date: ____________ Movements: ____________ Start time: ____________ Finish time: ____________ °Date: ____________ Movements: ____________ Start time: ____________ Finish time: ____________ °Date: ____________ Movements: ____________ Start time: ____________ Finish time: ____________ °Date: ____________ Movements: ____________ Start time: ____________ Finish time: ____________ °Date: ____________ Movements: ____________ Start time: ____________ Finish time: ____________ °Date: ____________ Movements: ____________ Start time: ____________ Finish time: ____________ °  °This information is not intended to replace advice given to you by your health care provider. Make sure you discuss any questions you have with your health care provider. °  °Document Released: 11/11/2006 Document Revised: 11/02/2014 Document Reviewed: 08/08/2012 °Elsevier Interactive Patient Education ©2016 Elsevier Inc. ° °

## 2016-01-03 NOTE — MAU Note (Signed)
Dr Mindi SlickerBanga called about fetal heart tracing. Orders for BPP given, recheck patient in 1 hour and pt may ambulate if she desires.

## 2016-01-03 NOTE — MAU Note (Signed)
Fetal heart tracing reviewed with Dr Mindi SlickerBanga, continue to monitor patient for 1 hour. Will call back after one hour of monitoring.

## 2016-01-03 NOTE — MAU Note (Signed)
Pt reports contractions every 5 mins x2 hours. Denies LOF or vag bleeding. +FM. Was 2cm 2 weeks ago in office.

## 2016-01-06 ENCOUNTER — Encounter (HOSPITAL_COMMUNITY): Payer: Self-pay | Admitting: *Deleted

## 2016-01-06 ENCOUNTER — Telehealth (HOSPITAL_COMMUNITY): Payer: Self-pay | Admitting: *Deleted

## 2016-01-06 NOTE — Telephone Encounter (Signed)
Preadmission screen  

## 2016-01-12 ENCOUNTER — Encounter (HOSPITAL_COMMUNITY): Payer: Self-pay

## 2016-01-12 DIAGNOSIS — Z3483 Encounter for supervision of other normal pregnancy, third trimester: Secondary | ICD-10-CM

## 2016-01-12 HISTORY — DX: Encounter for supervision of other normal pregnancy, third trimester: Z34.83

## 2016-01-12 NOTE — H&P (Signed)
Mikayla [name] is a 20 y.o. female G3P2002 at 39+ for IOL given term and favorable cervix.  Relatively uncomplicated Prenatal care.  +FM, no LOF< no VB,occ ctx.  D/w pt IOL and process, inc r/b/a - wish to proceed.  Group B strep negative, nl [place].  Maternal Medical History:  Contractions: Frequency: irregular.   Perceived severity is moderate.    Fetal activity: Perceived fetal activity is normal.    Prenatal complications: no prenatal complications Prenatal Complications - Diabetes: none.    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 Infection   . UTI (lower urinary tract infection)   . Asthma   . Normal pregnancy, repeat 08/18/2013  . SVD (spontaneous vaginal delivery) 08/19/2013  . Hx of varicella   . ADHD (attention deficit hyperactivity disorder)   . Bipolar disorder (HCC)     no treatment needed  . Encounter for supervision of normal pregnancy in multigravida in third trimester 01/12/2016   Past Surgical History  Procedure Laterality Date  . No past surgeries     Family History: family history includes Autism in her daughter; Leukemia in her daughter; Miscarriages / [place] in her mother. Social History:  reports that she quit smoking about 3 years ago. She has never used smokeless tobacco. She reports that she does not drink alcohol or use illicit drugs.married, SAHM Meds PNV All Codeine, PCN   Prenatal Transfer Tool  Maternal Diabetes: No Genetic Screening: Normal Maternal Ultrasounds/Referrals: Normal Fetal Ultrasounds or other Referrals:  None Maternal Substance Abuse:  No Significant Maternal Medications:  None Significant Maternal Lab Results:  Lab values include: Group B Strep negative Other Comments:  None  Review of  Systems  Constitutional: Negative.   HENT: Negative.   Eyes: Negative.   Respiratory: Negative.   Cardiovascular: Negative.   Gastrointestinal: Negative.   Genitourinary: Negative.   Musculoskeletal: Negative.   Skin: Negative.   Neurological: Negative.   Psychiatric/Behavioral: Negative.       Last menstrual period 09/12/2014, unknown if currently breastfeeding. Maternal Exam:  Uterine Assessment: Contraction frequency is irregular.   Abdomen: Patient reports no abdominal tenderness. Fundal height is appropriate for gestation.   Estimated fetal weight is 7.5-8.5#.   Fetal presentation: vertex  Introitus: Normal vulva. Normal vagina.  Cervix: Cervix evaluated by digital exam.     Physical Exam  Constitutional: She is oriented to person, place, and time. She appears well-developed and well-nourished.  HENT:  Head: Normocephalic and atraumatic.  Cardiovascular: Normal rate and regular rhythm.   Respiratory: Effort normal and breath sounds normal. No respiratory distress. She has no wheezes.  GI: Soft. Bowel sounds are normal. She exhibits no distension. There is no tenderness.  Musculoskeletal: Normal range of motion.  Neurological: She is alert and oriented to person, place, and time.  Skin: Skin is warm and dry.  Psychiatric: She has a normal mood and affect. Her behavior is normal.    Prenatal labs: ABO, Rh: A/Positive/-- (08/23 0000) Antibody: Negative (08/23 0000) Rubella: Immune (08/23 0000) RPR: Nonreactive (08/23 0000)  HBsAg: Negative (08/23 0000)  HIV: Non-reactive (08/23 0000)  GBS: Negative (02/19 0000)   Flu 06/18/15 Tdap 10/17/15  CF neg/Hgb electro WNL/Hgb 12.7/Plt 244k/ GC neg/ Chl neg/ First Tri and AFP WNL/glucola 106/  Nl anat, ant plac, female  Assessment/Plan: 04VW [id number] G3P2002 at 39+ for IOL Pitocin and AROM for IOL  Epidural prn Expect SVD  [name]-Stuckert, Znya Albino 01/12/2016, 9:49 PM

## 2016-01-13 ENCOUNTER — Encounter (HOSPITAL_COMMUNITY): Payer: Self-pay

## 2016-01-13 ENCOUNTER — Inpatient Hospital Stay (HOSPITAL_COMMUNITY)
Admission: RE | Admit: 2016-01-13 | Discharge: 2016-01-14 | DRG: 775 | Disposition: A | Discharge status: Home / Self Care | Payer: Medicaid Other | Source: Ambulatory Visit | Attending: Obstetrics and Gynecology | Admitting: Obstetrics and Gynecology

## 2016-01-13 ENCOUNTER — Inpatient Hospital Stay (HOSPITAL_COMMUNITY): Payer: Medicaid Other | Admitting: Anesthesiology

## 2016-01-13 VITALS — BP 119/57 | HR 122 | Temp 98.4°F | Resp 18 | Ht 64.0 in | Wt 201.0 lb

## 2016-01-13 DIAGNOSIS — Z87891 Personal history of nicotine dependence: Secondary | ICD-10-CM

## 2016-01-13 DIAGNOSIS — O99344 Other mental disorders complicating childbirth: Secondary | ICD-10-CM | POA: Diagnosis present

## 2016-01-13 DIAGNOSIS — Z23 Encounter for immunization: Secondary | ICD-10-CM

## 2016-01-13 DIAGNOSIS — Z3483 Encounter for supervision of other normal pregnancy, third trimester: Secondary | ICD-10-CM

## 2016-01-13 DIAGNOSIS — Z3A39 39 weeks gestation of pregnancy: Secondary | ICD-10-CM

## 2016-01-13 HISTORY — DX: Encounter for supervision of other normal pregnancy, third trimester: Z34.83

## 2016-01-13 LAB — CBC
HEMATOCRIT: 33.2 % — AB (ref 36.0–46.0)
Hemoglobin: 10.9 g/dL — ABNORMAL LOW (ref 12.0–15.0)
MCH: 25.6 pg — AB (ref 26.0–34.0)
MCHC: 32.8 g/dL (ref 30.0–36.0)
MCV: 77.9 fL — AB (ref 78.0–100.0)
Platelets: 232 10*3/uL (ref 150–400)
RBC: 4.26 MIL/uL (ref 3.87–5.11)
RDW: 13.6 % (ref 11.5–15.5)
WBC: 7.9 10*3/uL (ref 4.0–10.5)

## 2016-01-13 LAB — ABO/RH: ABO/RH(D): A POS

## 2016-01-13 LAB — TYPE AND SCREEN
ABO/RH(D): A POS
Antibody Screen: NEGATIVE

## 2016-01-13 MED ORDER — SENNOSIDES-DOCUSATE SODIUM 8.6-50 MG PO TABS
2.0000 | ORAL_TABLET | ORAL | Status: DC
  Administered 2016-01-13: 2 via ORAL
  Filled 2016-01-13: qty 2

## 2016-01-13 MED ORDER — OXYCODONE-ACETAMINOPHEN 5-325 MG PO TABS: 1.0000 | ORAL_TABLET | ORAL | Status: DC | PRN

## 2016-01-13 MED ORDER — LACTATED RINGERS IV SOLN: 500.0000 mL | Freq: Once | INTRAVENOUS | Status: DC

## 2016-01-13 MED ORDER — BUTORPHANOL TARTRATE 1 MG/ML IJ SOLN: 1.0000 mg | INTRAMUSCULAR | Status: DC | PRN

## 2016-01-13 MED ORDER — SODIUM BICARBONATE 8.4 % IV SOLN
INTRAVENOUS | Status: DC | PRN
  Administered 2016-01-13: 5 mL via EPIDURAL

## 2016-01-13 MED ORDER — LACTATED RINGERS IV SOLN: INTRAVENOUS | Status: DC

## 2016-01-13 MED ORDER — OXYCODONE-ACETAMINOPHEN 5-325 MG PO TABS
1.0000 | ORAL_TABLET | ORAL | Status: DC | PRN
Start: 2016-01-13 — End: 2016-01-13

## 2016-01-13 MED ORDER — TERBUTALINE SULFATE 1 MG/ML IJ SOLN
0.2500 mg | Freq: Once | INTRAMUSCULAR | Status: DC | PRN
Start: 2016-01-13 — End: 2016-01-13
  Filled 2016-01-13: qty 1

## 2016-01-13 MED ORDER — PHENYLEPHRINE 40 MCG/ML (10ML) SYRINGE FOR IV PUSH (FOR BLOOD PRESSURE SUPPORT)
80.0000 ug | PREFILLED_SYRINGE | INTRAVENOUS | Status: DC | PRN
  Administered 2016-01-13 (×2): 80 ug via INTRAVENOUS
  Filled 2016-01-13: qty 2

## 2016-01-13 MED ORDER — OXYCODONE-ACETAMINOPHEN 5-325 MG PO TABS: 2.0000 | ORAL_TABLET | ORAL | Status: DC | PRN

## 2016-01-13 MED ORDER — DIPHENHYDRAMINE HCL 50 MG/ML IJ SOLN: 12.5000 mg | INTRAMUSCULAR | Status: DC | PRN

## 2016-01-13 MED ORDER — ACETAMINOPHEN 325 MG PO TABS: 650.0000 mg | ORAL_TABLET | ORAL | Status: DC | PRN

## 2016-01-13 MED ORDER — DIPHENHYDRAMINE HCL 25 MG PO CAPS: 25.0000 mg | ORAL_CAPSULE | Freq: Four times a day (QID) | ORAL | Status: DC | PRN

## 2016-01-13 MED ORDER — PRENATAL MULTIVITAMIN CH
1.0000 | ORAL_TABLET | Freq: Every day | ORAL | Status: DC
  Administered 2016-01-14: 1 via ORAL
  Filled 2016-01-13: qty 1

## 2016-01-13 MED ORDER — PNEUMOCOCCAL VAC POLYVALENT 25 MCG/0.5ML IJ INJ
0.5000 mL | INJECTION | INTRAMUSCULAR | Status: AC
  Administered 2016-01-14: 0.5 mL via INTRAMUSCULAR
  Filled 2016-01-13 (×3): qty 0.5

## 2016-01-13 MED ORDER — ONDANSETRON HCL 4 MG PO TABS: 4.0000 mg | ORAL_TABLET | ORAL | Status: DC | PRN

## 2016-01-13 MED ORDER — EPHEDRINE 5 MG/ML INJ
10.0000 mg | INTRAVENOUS | Status: DC | PRN
  Filled 2016-01-13: qty 2
  Filled 2016-01-13: qty 4

## 2016-01-13 MED ORDER — LANOLIN HYDROUS EX OINT: TOPICAL_OINTMENT | CUTANEOUS | Status: DC | PRN

## 2016-01-13 MED ORDER — LACTATED RINGERS IV SOLN
INTRAVENOUS | Status: DC
  Administered 2016-01-13: 08:00:00 via INTRAVENOUS

## 2016-01-13 MED ORDER — TETANUS-DIPHTH-ACELL PERTUSSIS 5-2.5-18.5 LF-MCG/0.5 IM SUSP: 0.5000 mL | Freq: Once | INTRAMUSCULAR | Status: DC

## 2016-01-13 MED ORDER — CITRIC ACID-SODIUM CITRATE 334-500 MG/5ML PO SOLN: 30.0000 mL | ORAL | Status: DC | PRN

## 2016-01-13 MED ORDER — FENTANYL 2.5 MCG/ML BUPIVACAINE 1/10 % EPIDURAL INFUSION (WH - ANES)
14.0000 mL/h | INTRAMUSCULAR | Status: DC | PRN
  Administered 2016-01-13: 14 mL/h via EPIDURAL
  Filled 2016-01-13: qty 125

## 2016-01-13 MED ORDER — DIBUCAINE 1 % RE OINT: 1.0000 "application " | TOPICAL_OINTMENT | RECTAL | Status: DC | PRN

## 2016-01-13 MED ORDER — ACETAMINOPHEN 325 MG PO TABS
650.0000 mg | ORAL_TABLET | ORAL | Status: DC | PRN
  Administered 2016-01-14: 650 mg via ORAL
  Filled 2016-01-13: qty 2

## 2016-01-13 MED ORDER — ZOLPIDEM TARTRATE 5 MG PO TABS: 5.0000 mg | ORAL_TABLET | Freq: Every evening | ORAL | Status: DC | PRN

## 2016-01-13 MED ORDER — OXYTOCIN 10 UNIT/ML IJ SOLN
2.5000 [IU]/h | INTRAVENOUS | Status: DC
  Filled 2016-01-13: qty 10

## 2016-01-13 MED ORDER — SIMETHICONE 80 MG PO CHEW: 80.0000 mg | CHEWABLE_TABLET | ORAL | Status: DC | PRN

## 2016-01-13 MED ORDER — ONDANSETRON HCL 4 MG/2ML IJ SOLN: 4.0000 mg | Freq: Four times a day (QID) | INTRAMUSCULAR | Status: DC | PRN

## 2016-01-13 MED ORDER — IBUPROFEN 600 MG PO TABS
600.0000 mg | ORAL_TABLET | Freq: Four times a day (QID) | ORAL | Status: DC
  Administered 2016-01-13 – 2016-01-14 (×4): 600 mg via ORAL
  Filled 2016-01-13 (×4): qty 1

## 2016-01-13 MED ORDER — LIDOCAINE HCL (PF) 1 % IJ SOLN
INTRAMUSCULAR | Status: DC | PRN
  Administered 2016-01-13 (×2): 4 mL via EPIDURAL

## 2016-01-13 MED ORDER — EPHEDRINE 5 MG/ML INJ
10.0000 mg | INTRAVENOUS | Status: DC | PRN
  Administered 2016-01-13: 10 mg via INTRAVENOUS
  Filled 2016-01-13: qty 2

## 2016-01-13 MED ORDER — WITCH HAZEL-GLYCERIN EX PADS: 1.0000 "application " | MEDICATED_PAD | CUTANEOUS | Status: DC | PRN

## 2016-01-13 MED ORDER — ONDANSETRON HCL 4 MG/2ML IJ SOLN: 4.0000 mg | INTRAMUSCULAR | Status: DC | PRN

## 2016-01-13 MED ORDER — PHENYLEPHRINE 40 MCG/ML (10ML) SYRINGE FOR IV PUSH (FOR BLOOD PRESSURE SUPPORT)
80.0000 ug | PREFILLED_SYRINGE | INTRAVENOUS | Status: DC | PRN
  Filled 2016-01-13: qty 2
  Filled 2016-01-13: qty 20

## 2016-01-13 MED ORDER — BENZOCAINE-MENTHOL 20-0.5 % EX AERO: 1.0000 "application " | INHALATION_SPRAY | CUTANEOUS | Status: DC | PRN

## 2016-01-13 MED ORDER — OXYTOCIN 10 UNIT/ML IJ SOLN
1.0000 m[IU]/min | INTRAVENOUS | Status: DC
  Administered 2016-01-13: 2 m[IU]/min via INTRAVENOUS

## 2016-01-13 MED ORDER — LIDOCAINE HCL (PF) 1 % IJ SOLN
30.0000 mL | INTRAMUSCULAR | Status: DC | PRN
  Filled 2016-01-13: qty 30

## 2016-01-13 MED ORDER — LACTATED RINGERS IV SOLN
500.0000 mL | INTRAVENOUS | Status: DC | PRN
  Administered 2016-01-13: 250 mL via INTRAVENOUS
  Administered 2016-01-13: 1000 mL via INTRAVENOUS
  Administered 2016-01-13 (×2): 500 mL via INTRAVENOUS

## 2016-01-13 MED ORDER — LACTATED RINGERS IV SOLN
500.0000 mL | Freq: Once | INTRAVENOUS | Status: DC
Start: 2016-01-13 — End: 2016-01-14

## 2016-01-13 MED ORDER — OXYTOCIN BOLUS FROM INFUSION: 500.0000 mL | INTRAVENOUS | Status: DC

## 2016-01-13 NOTE — Anesthesia Preprocedure Evaluation (Signed)
Anesthesia Evaluation  Patient identified by MRN, date of birth, ID band Patient awake    Reviewed: Allergy & Precautions, NPO status , Patient's Chart, lab work & pertinent test results  Airway Mallampati: II  TM Distance: >3 FB Neck ROM: Full    Dental  (+) Teeth Intact   Pulmonary asthma , former smoker,    breath sounds clear to auscultation       Cardiovascular negative cardio ROS   Rhythm:Regular Rate:Normal     Neuro/Psych  Headaches, PSYCHIATRIC DISORDERS Bipolar Disorder    GI/Hepatic negative GI ROS, Neg liver ROS,   Endo/Other  negative endocrine ROS  Renal/GU negative Renal ROS  negative genitourinary   Musculoskeletal negative musculoskeletal ROS (+)   Abdominal   Peds negative pediatric ROS (+)  Hematology negative hematology ROS (+)   Anesthesia Other Findings   Reproductive/Obstetrics (+) Pregnancy                             Lab Results  Component Value Date   WBC 7.9 01/13/2016   HGB 10.9* 01/13/2016   HCT 33.2* 01/13/2016   MCV 77.9* 01/13/2016   PLT 232 01/13/2016   No results found for: INR, PROTIME   Anesthesia Physical Anesthesia Plan  ASA: II  Anesthesia Plan: Epidural   Post-op Pain Management:    Induction:   Airway Management Planned:   Additional Equipment:   Intra-op Plan:   Post-operative Plan:   Informed Consent: I have reviewed the patients History and Physical, chart, labs and discussed the procedure including the risks, benefits and alternatives for the proposed anesthesia with the patient or authorized representative who has indicated his/her understanding and acceptance.     Plan Discussed with:   Anesthesia Plan Comments:         Anesthesia Quick Evaluation

## 2016-01-13 NOTE — Anesthesia Procedure Notes (Signed)
Epidural Patient location during procedure: OB Start time: 01/13/2016 10:40 AM End time: 01/13/2016 10:46 AM  Staffing Anesthesiologist: Shona SimpsonHOLLIS, Melania Kirks D Performed by: anesthesiologist   Preanesthetic Checklist Completed: patient identified, site marked, surgical consent, pre-op evaluation, timeout performed, IV checked, risks and benefits discussed and monitors and equipment checked  Epidural Patient position: sitting Prep: ChloraPrep Patient monitoring: heart rate, continuous pulse ox and blood pressure Approach: midline Location: L3-L4 Injection technique: LOR saline  Needle:  Needle type: Tuohy  Needle gauge: 17 G Needle length: 9 cm Catheter type: closed end flexible Catheter size: 20 Guage Test dose: negative and 1.5% lidocaine  Assessment Events: blood not aspirated, injection not painful, no injection resistance and no paresthesia  Additional Notes LOR @ 6  Patient identified. Risks/Benefits/Options discussed with patient including but not limited to bleeding, infection, nerve damage, paralysis, failed block, incomplete pain control, headache, blood pressure changes, nausea, vomiting, reactions to medications, itching and postpartum back pain. Confirmed with bedside nurse the patient's most recent platelet count. Confirmed with patient that they are not currently taking any anticoagulation, have any bleeding history or any family history of bleeding disorders. Patient expressed understanding and wished to proceed. All questions were answered. Sterile technique was used throughout the entire procedure. Please see nursing notes for vital signs. Test dose was given through epidural catheter and negative prior to continuing to dose epidural or start infusion. Warning signs of high block given to the patient including shortness of breath, tingling/numbness in hands, complete motor block, or any concerning symptoms with instructions to call for help. Patient was given instructions on  fall risk and not to get out of bed. All questions and concerns addressed with instructions to call with any issues or inadequate analgesia.    Reason for block:procedure for pain

## 2016-01-13 NOTE — Progress Notes (Signed)
Patient ID: Mikayla [name], female   DOB: 08/28/1996, 20 y.o.   MRN: [id number]  No c/o's  AFVSS gen NAD FHTs 140's, variable, good var toco occ  AROM for clear fluid w/o diff/comp  SROM 4.5/70/-2  19yo G3P2002 at 39+ for IOL AROM and pitocin to induce labor Expect SVD

## 2016-01-13 NOTE — Progress Notes (Signed)
Patient ID: Mikayla [name], female   DOB: April 18, 1996, 20 y.o.   MRN: [id number]  Some discomfort/pressure with ctx.  Has had 2 bolus doses  AFVSS gen NAD FHTs 120's, good variability, variable decels with ctx.  6/90/-1 per RN  toco q 2 min  Expect SVD soon Close monitoring

## 2016-01-13 NOTE — Lactation Note (Signed)
This note was copied from a baby's chart. Lactation Consultation Note  Patient Name: Mikayla [name] ZOXWR'UToday's Date: 01/13/2016 Reason for consult: Initial assessment Baby at 9 hr of life and mom reports bf is going well. She had trouble latching her other 2 but reports this baby is doing "great". Denies breast or nipple pain, voiced no concerns. Discussed baby behavior, feeding frequency, baby belly size, voids, wt loss, breast changes, and nipple care. Given lactation handouts. Aware of OP services and support group.    Maternal Data Has patient been taught Hand Expression?: Yes Does the patient have breastfeeding experience prior to this delivery?: Yes  Feeding Feeding Type: Breast Fed Length of feed: 15 min  LATCH Score/Interventions                      Lactation Tools Discussed/Used     Consult Status Consult Status: Follow-up Date: 01/14/16 Follow-up type: In-patient    Mikayla [name] 01/13/2016, 11:04 PM

## 2016-01-13 NOTE — Anesthesia Postprocedure Evaluation (Signed)
Anesthesia Post Note  Patient: Mikayla [name]  Procedure(s) Performed: * No procedures listed *  Patient location during evaluation: Mother Baby Anesthesia Type: Epidural Level of consciousness: awake and alert Pain management: satisfactory to patient Vital Signs Assessment: post-procedure vital signs reviewed and stable Respiratory status: respiratory function stable Cardiovascular status: stable Postop Assessment: no headache, no backache, epidural receding, patient able to bend at knees, no signs of nausea or vomiting and adequate PO intake Anesthetic complications: no Comments: Comfort level was assessed by AnesthesiaTeam and the patient was pleased with the care, interventions, and services provided by the Department of Anesthesia.    Last Vitals:  Filed Vitals:   01/13/16 1645 01/13/16 2035  BP: 134/61 122/57  Pulse: 75 73  Temp: 36.5 C 36.7 C  Resp: 18 18    Last Pain:  Filed Vitals:   01/13/16 2052  PainSc: 1                  Zamoria Boss

## 2016-01-14 LAB — CBC
HCT: 31 % — ABNORMAL LOW (ref 36.0–46.0)
HEMOGLOBIN: 10 g/dL — AB (ref 12.0–15.0)
MCH: 25.3 pg — AB (ref 26.0–34.0)
MCHC: 32.3 g/dL (ref 30.0–36.0)
MCV: 78.5 fL (ref 78.0–100.0)
PLATELETS: 208 10*3/uL (ref 150–400)
RBC: 3.95 MIL/uL (ref 3.87–5.11)
RDW: 13.5 % (ref 11.5–15.5)
WBC: 11 10*3/uL — AB (ref 4.0–10.5)

## 2016-01-14 LAB — RPR: RPR Ser Ql: NONREACTIVE

## 2016-01-14 LAB — CCBB MATERNAL DONOR DRAW

## 2016-01-14 MED ORDER — IBUPROFEN 800 MG PO TABS: 800.0000 mg | ORAL_TABLET | Freq: Three times a day (TID) | ORAL | Status: DC | PRN

## 2016-01-14 MED ORDER — PRENATAL MULTIVITAMIN CH: 1.0000 | ORAL_TABLET | Freq: Every day | ORAL | Status: AC

## 2016-01-14 MED ORDER — OXYCODONE-ACETAMINOPHEN 5-325 MG PO TABS: 1.0000 | ORAL_TABLET | Freq: Four times a day (QID) | ORAL | Status: DC | PRN

## 2016-01-14 NOTE — Discharge Summary (Signed)
Obstetric Discharge Summary Reason for Admission: induction of labor Prenatal Procedures: none Intrapartum Procedures: spontaneous vaginal delivery Postpartum Procedures: none Complications-Operative and Postpartum: none HEMOGLOBIN  Date Value Ref Range Status  01/14/2016 10.0* 12.0 - 15.0 g/dL Final   HCT  Date Value Ref Range Status  01/14/2016 31.0* 36.0 - 46.0 % Final    Physical Exam:  General: alert and no distress Lochia: appropriate Uterine Fundus: firm  Discharge Diagnoses: Term Pregnancy-delivered  Discharge Information: Date: 01/14/2016 Activity: pelvic rest Diet: routine Medications: PNV, Ibuprofen and Percocet Condition: stable Instructions: refer to practice specific booklet Discharge to: home Follow-up Information    Follow up with [name]-Stuckert, Ramy Greth, MD. Schedule an appointment as soon as possible for a visit in 6 weeks.   Specialty:  Obstetrics and Gynecology   Why:  for postpartum check   Contact information:   [address] [place] [id number] [phone]       Newborn Data: Live born female [name] Birth Weight: 7 lb 3.5 oz (3274 g) APGAR: 9, 9  Home with mother.  [name]-Stuckert, Glenora Morocho 01/14/2016, 9:10 AM

## 2016-01-14 NOTE — Clinical Social Work Maternal (Signed)
CLINICAL SOCIAL WORK MATERNAL/CHILD NOTE  Patient Details  Name: Mikayla [name] C Bunkley MRN: [id number] Date of Birth: 09-May-1996  Date:  01/14/2016  Clinical Social Worker Initiating Note:  Loleta BooksSarah Alice Burnside MSW, LCSW Date/ Time Initiated:  01/14/16/1000     Child's Name:  Mikayla [name]   Legal Guardian:  Arbutus Pedeaspnn and Orson Slickhase Doeden  Need for Interpreter:  None   Date of Referral:  01/13/16     Reason for Referral:  Behavioral Health Issues, including SI    [facility]   Address:  [address] [place], [place] [address]  Phone number:  [phone]   Household Members:  Minor Children, Spouse   Natural Supports (not living in the home):  Immediate Family, Extended Family   Professional Supports: Visual merchandiserTherapist and Psychiatrist at [facility]  Employment: Student   Type of Work:   N/A  Education:  Holiday representativeAttending college   Financial Resources:  Medicaid   Other Resources:  [employer], Sales executive[employer] Stamps    Cultural/Religious Considerations Which May Impact Care:  None reported  Strengths:  Ability to meet basic needs , Home prepared for child , Pediatrician chosen    Risk Factors/Current Problems:   1. Mental Health Concerns: MOB presents with a history of a bipolar (since childhood). She stated that she is currently not on medication, but is closely followed [facility] her [facility].  Cognitive State:  Able to Concentrate , Alert , Goal Oriented , Linear Thinking , Insightful    Mood/Affect:  Happy , Animated, Calm , Bright    CSW Assessment:  CSW received request for consult due to MOB presenting with a history of bipolar and depression. FOB was sleeping during the assessment.  MOB presented as easily engaged and receptive to the visit. She was noted to be in a pleasant mood, displayed a full range in affect, and was observed to be caring for the infant during the entire visit.  No acute mental health symptoms observed or  noted in her thought process.  MOB confirmed history of bipolar, and stated that she began to exhibit mood symptoms as a young child.  MOB described the pregnancy as emotionally challenging since she was not on any psychotropic medications. She shared that she was on Latuda and additional mood stabilizers that she could not recall the name of, but discontinued once she learned that she was pregnant.  MOB shared that she was closely followed her psychiatrist and therapist at [facility] in order to provide her with additional support.  MOB shared that she has a follow up appointment in April in order to re-evaluate for medications now that she is no longer pregnant.   MOB denied prior history of a perinatal mood disorder, and shared that she is surprised given her mental health history. MOB discussed belief that she may have always had a smooth transition postpartum due to her strong support system. She stated that after each pregnancy, she would have frequent visitors in her home which allowed her to sleep and to care for herself.  MOB reported that she continues to have a strong support system. She identified the FOB as her primary source of support, but stated that his immediate family is also readily available for when he is away at work.   MOB expressed confidence in her coping skills and ability to manage her mental health. She recognized that even if she experiences a perinatal mood disorder postpartum, that she has a  strong support system and mental health providers who will be able to assist her.    MOB shared that she is happy and excited to be a mother. She stated that she is proud of herself as a mother since she was told that growing up she'd never be able to be a good mother since she has a history of mental health diagnoses. She smiled and reflected upon how she is able to prove people wrong, and stated that she continues to put forth effort to be a good role model for her children.  She stated that she is in school to become a Engineer, site, with an anticipated graduation in December 2017. Per MOB, she wants to demonstrate to her children that "they can do anything".    MOB denied questions, concerns, or needs as she prepares for discharge. She expressed eagerness and readiness for discharge since she misses her other children.  MOB confirmed that the home is prepared for the infant, and that they have a car seat.  MOB shared that she hopes to be able to be eligible for an "early discharge", but verbalized understanding if she could not.  MOB stated that the childbirth experience has been pleasant, faster and better than she had anticipated, and reported smooth transition postpartum.   MOB expressed appreciation for the visit, and again expressed confidence in her support system, her mental health care providers, and previously learned coping skills to support her mental health postpartum. MOB was receptive to re-education on the baby blues and perinatal mood disorders, and stated that she will follow up with her mental health providers as needs arise.    CSW Plan/Description:   1. Patient/Family Education-- perinatal mood and anxiety disorders 2. Information/Referral to [employer]-- perinatal mood disorder online resources, Feelings After Birth support group 3. No Further Intervention Required/No Barriers to Discharge    Kelby Fam 01/14/2016, 10:48 AM

## 2016-01-14 NOTE — Progress Notes (Addendum)
Post Partum Day 1 Subjective: no complaints, up ad lib, voiding, tolerating PO and nl lochia, pain controlled  Objective: Blood pressure 119/57, pulse 73, temperature 98.4 F (36.9 C), temperature source Oral, resp. rate 18, height 5\' 4"  (1.626 m), weight 91.173 kg (201 lb), last menstrual period 09/12/2014, SpO2 99 %, unknown if currently breastfeeding.  Physical Exam:  General: alert and no distress Lochia: appropriate Uterine Fundus: firm    Recent Labs  01/13/16 0815 01/14/16 0529  HGB 10.9* 10.0*  HCT 33.2* 31.0*    Assessment/Plan: Plan for discharge tomorrow, Breastfeeding and Lactation consult.  Routine PP care.  If desires early d/c - d/c with Motrin and PNV, f/u 6 weeks   LOS: 1 day   [name]-Stuckert, Tandre Conly 01/14/2016, 9:02 AM   Pt desires d/c and percocet.

## 2016-01-14 NOTE — Progress Notes (Signed)
UR chart review completed.  

## 2016-01-14 NOTE — Lactation Note (Signed)
This note was copied from a baby's chart. Lactation Consultation Note  Patient Name: Girl Thurmond ButtsDeaspnn Pitre NWGNF'AToday's Date: 01/14/2016 Reason for consult: Follow-up assessment;Difficult latch  Baby 21 hours old. Mom reports that her nipples were becoming sore so FOB gave a bottle--60 ml of Alimentum. Mom has supplementation guidelines, and reviewed supplementation amounts with parents---EBM and formula. Mom has comfort gels but is not using them. Mom states that she did not put baby to breast last night to "rest her breasts." Discussed supply and demand and normal progression of milk coming to breast. Mom states that she may want to pump and bottle-feed EBM. Set mom up with DEBP and mom able to pump a few drops of colostrum. Reviewed EBM storage guidelines. Enc mom to put baby to breast first with each feeding (as mom is able), then supplement with EBM/formula according to supplementation guidelines, and then postpump for 15 minutes followed by hand expression.   Discussed assessment, interventions and feeding plan with patient's bedside nurse, Val, RN.  Maternal Data    Feeding Feeding Type: Breast Fed Length of feed: 30 min  LATCH Score/Interventions                      Lactation Tools Discussed/Used Pump Review: Setup, frequency, and cleaning;Milk Storage Initiated by:: JW Date initiated:: 01/14/16   Consult Status Consult Status: Follow-up Date: 01/15/16 Follow-up type: In-patient    Geralynn OchsWILLIARD, Arben Packman 01/14/2016, 11:54 AM

## 2018-10-26 NOTE — L&D Delivery Note (Signed)
Delivery Note Pt pushed twice and at 12:28 PM a viable female was delivered via Vaginal, Spontaneous (Presentation: ROA;  ).  APGAR:  8,9, ; weight pending .   Placenta status: delivered, intact,schultz .  Cord: 3vc with the following complications: nuchal x 1.  Cord pH: n/a  Anesthesia:  Episiotomy Episiotomy: None Lacerations: None Suture Repair: n/a Est. Blood Loss (mL): 252  Mom to postpartum.  Baby to Couplet care / Skin to Skin  Desires circumcision in hospital.  Venetia Night [facility] 07/15/2019, 12:51 PM

## 2019-07-14 ENCOUNTER — Inpatient Hospital Stay (HOSPITAL_COMMUNITY)
Admission: AD | Admit: 2019-07-14 | Discharge: 2019-07-16 | DRG: 807 | Disposition: A | Discharge status: Home / Self Care | Payer: Medicaid Other | Attending: Obstetrics and Gynecology | Admitting: Obstetrics and Gynecology

## 2019-07-14 ENCOUNTER — Encounter (HOSPITAL_COMMUNITY): Payer: Self-pay | Admitting: *Deleted

## 2019-07-14 DIAGNOSIS — O9902 Anemia complicating childbirth: Secondary | ICD-10-CM | POA: Diagnosis present

## 2019-07-14 DIAGNOSIS — Z3A38 38 weeks gestation of pregnancy: Secondary | ICD-10-CM

## 2019-07-14 DIAGNOSIS — Z20828 Contact with and (suspected) exposure to other viral communicable diseases: Secondary | ICD-10-CM | POA: Diagnosis present

## 2019-07-14 DIAGNOSIS — D649 Anemia, unspecified: Secondary | ICD-10-CM | POA: Diagnosis present

## 2019-07-14 DIAGNOSIS — Z880 Allergy status to penicillin: Secondary | ICD-10-CM

## 2019-07-14 DIAGNOSIS — Z87891 Personal history of nicotine dependence: Secondary | ICD-10-CM

## 2019-07-14 DIAGNOSIS — Z23 Encounter for immunization: Secondary | ICD-10-CM

## 2019-07-14 HISTORY — DX: Depression, unspecified: F32.A

## 2019-07-14 HISTORY — DX: Anxiety disorder, unspecified: F41.9

## 2019-07-14 NOTE — MAU Note (Signed)
Pt reports to MAU c/o ctx every 2-58min. Pt reports some bloody show after her membrane sweep. Pt denies LOF. +FM.

## 2019-07-15 ENCOUNTER — Inpatient Hospital Stay (HOSPITAL_COMMUNITY): Payer: Medicaid Other | Admitting: Anesthesiology

## 2019-07-15 ENCOUNTER — Encounter (HOSPITAL_COMMUNITY): Payer: Self-pay

## 2019-07-15 ENCOUNTER — Other Ambulatory Visit: Payer: Self-pay

## 2019-07-15 DIAGNOSIS — O9902 Anemia complicating childbirth: Secondary | ICD-10-CM | POA: Diagnosis present

## 2019-07-15 DIAGNOSIS — Z87891 Personal history of nicotine dependence: Secondary | ICD-10-CM | POA: Diagnosis not present

## 2019-07-15 DIAGNOSIS — Z20828 Contact with and (suspected) exposure to other viral communicable diseases: Secondary | ICD-10-CM | POA: Diagnosis present

## 2019-07-15 DIAGNOSIS — O26893 Other specified pregnancy related conditions, third trimester: Secondary | ICD-10-CM | POA: Diagnosis present

## 2019-07-15 DIAGNOSIS — Z23 Encounter for immunization: Secondary | ICD-10-CM | POA: Diagnosis not present

## 2019-07-15 DIAGNOSIS — D649 Anemia, unspecified: Secondary | ICD-10-CM | POA: Diagnosis present

## 2019-07-15 DIAGNOSIS — Z3A38 38 weeks gestation of pregnancy: Secondary | ICD-10-CM | POA: Diagnosis not present

## 2019-07-15 DIAGNOSIS — Z880 Allergy status to penicillin: Secondary | ICD-10-CM | POA: Diagnosis not present

## 2019-07-15 LAB — TYPE AND SCREEN
ABO/RH(D): A POS
Antibody Screen: NEGATIVE

## 2019-07-15 LAB — ABO/RH: ABO/RH(D): A POS

## 2019-07-15 LAB — CBC
HCT: 33.7 % — ABNORMAL LOW (ref 36.0–46.0)
Hemoglobin: 10.9 g/dL — ABNORMAL LOW (ref 12.0–15.0)
MCH: 25.8 pg — ABNORMAL LOW (ref 26.0–34.0)
MCHC: 32.3 g/dL (ref 30.0–36.0)
MCV: 79.7 fL — ABNORMAL LOW (ref 80.0–100.0)
Platelets: 201 10*3/uL (ref 150–400)
RBC: 4.23 MIL/uL (ref 3.87–5.11)
RDW: 12.8 % (ref 11.5–15.5)
WBC: 8.4 10*3/uL (ref 4.0–10.5)
nRBC: 0 % (ref 0.0–0.2)

## 2019-07-15 LAB — RPR: RPR Ser Ql: NONREACTIVE

## 2019-07-15 LAB — SARS CORONAVIRUS 2 BY RT PCR (HOSPITAL ORDER, PERFORMED IN ~~LOC~~ HOSPITAL LAB): SARS Coronavirus 2: NEGATIVE

## 2019-07-15 MED ORDER — OXYTOCIN 40 UNITS IN NORMAL SALINE INFUSION - SIMPLE MED
1.0000 m[IU]/min | INTRAVENOUS | Status: DC
  Administered 2019-07-15: 07:00:00 2 m[IU]/min via INTRAVENOUS
  Filled 2019-07-15: qty 1000

## 2019-07-15 MED ORDER — PHENYLEPHRINE 40 MCG/ML (10ML) SYRINGE FOR IV PUSH (FOR BLOOD PRESSURE SUPPORT)
80.0000 ug | PREFILLED_SYRINGE | INTRAVENOUS | Status: DC | PRN
  Filled 2019-07-15: qty 10

## 2019-07-15 MED ORDER — PHENYLEPHRINE 40 MCG/ML (10ML) SYRINGE FOR IV PUSH (FOR BLOOD PRESSURE SUPPORT): 80.0000 ug | PREFILLED_SYRINGE | INTRAVENOUS | Status: DC | PRN

## 2019-07-15 MED ORDER — IBUPROFEN 600 MG PO TABS
600.0000 mg | ORAL_TABLET | Freq: Four times a day (QID) | ORAL | Status: DC
  Administered 2019-07-15 – 2019-07-16 (×4): 600 mg via ORAL
  Filled 2019-07-15 (×4): qty 1

## 2019-07-15 MED ORDER — FLEET ENEMA 7-19 GM/118ML RE ENEM: 1.0000 | ENEMA | RECTAL | Status: DC | PRN

## 2019-07-15 MED ORDER — SODIUM CHLORIDE (PF) 0.9 % IJ SOLN
INTRAMUSCULAR | Status: DC | PRN
  Administered 2019-07-15: 12 mL/h via EPIDURAL

## 2019-07-15 MED ORDER — TETANUS-DIPHTH-ACELL PERTUSSIS 5-2.5-18.5 LF-MCG/0.5 IM SUSP: 0.5000 mL | Freq: Once | INTRAMUSCULAR | Status: DC

## 2019-07-15 MED ORDER — ZOLPIDEM TARTRATE 5 MG PO TABS: 5.0000 mg | ORAL_TABLET | Freq: Every evening | ORAL | Status: DC | PRN

## 2019-07-15 MED ORDER — EPHEDRINE 5 MG/ML INJ: 10.0000 mg | INTRAVENOUS | Status: DC | PRN

## 2019-07-15 MED ORDER — LIDOCAINE HCL (PF) 1 % IJ SOLN
INTRAMUSCULAR | Status: DC | PRN
  Administered 2019-07-15 (×2): 5 mL via EPIDURAL

## 2019-07-15 MED ORDER — ACETAMINOPHEN 325 MG PO TABS: 650.0000 mg | ORAL_TABLET | ORAL | Status: DC | PRN

## 2019-07-15 MED ORDER — SOD CITRATE-CITRIC ACID 500-334 MG/5ML PO SOLN: 30.0000 mL | ORAL | Status: DC | PRN

## 2019-07-15 MED ORDER — DIPHENHYDRAMINE HCL 50 MG/ML IJ SOLN: 12.5000 mg | INTRAMUSCULAR | Status: DC | PRN

## 2019-07-15 MED ORDER — DIBUCAINE (PERIANAL) 1 % EX OINT: 1.0000 "application " | TOPICAL_OINTMENT | CUTANEOUS | Status: DC | PRN

## 2019-07-15 MED ORDER — TERBUTALINE SULFATE 1 MG/ML IJ SOLN: 0.2500 mg | Freq: Once | INTRAMUSCULAR | Status: DC | PRN

## 2019-07-15 MED ORDER — ONDANSETRON HCL 4 MG/2ML IJ SOLN: 4.0000 mg | INTRAMUSCULAR | Status: DC | PRN

## 2019-07-15 MED ORDER — WITCH HAZEL-GLYCERIN EX PADS: 1.0000 "application " | MEDICATED_PAD | CUTANEOUS | Status: DC | PRN

## 2019-07-15 MED ORDER — SIMETHICONE 80 MG PO CHEW: 80.0000 mg | CHEWABLE_TABLET | ORAL | Status: DC | PRN

## 2019-07-15 MED ORDER — ONDANSETRON HCL 4 MG/2ML IJ SOLN: 4.0000 mg | Freq: Four times a day (QID) | INTRAMUSCULAR | Status: DC | PRN

## 2019-07-15 MED ORDER — ONDANSETRON HCL 4 MG PO TABS: 4.0000 mg | ORAL_TABLET | ORAL | Status: DC | PRN

## 2019-07-15 MED ORDER — PRENATAL MULTIVITAMIN CH
1.0000 | ORAL_TABLET | Freq: Every day | ORAL | Status: DC
  Administered 2019-07-16: 1 via ORAL
  Filled 2019-07-15: qty 1

## 2019-07-15 MED ORDER — OXYTOCIN 40 UNITS IN NORMAL SALINE INFUSION - SIMPLE MED
2.5000 [IU]/h | INTRAVENOUS | Status: DC
  Administered 2019-07-15: 2.5 [IU]/h via INTRAVENOUS

## 2019-07-15 MED ORDER — LACTATED RINGERS IV SOLN
INTRAVENOUS | Status: DC
  Administered 2019-07-15: 10:00:00 via INTRAVENOUS

## 2019-07-15 MED ORDER — FENTANYL CITRATE (PF) 100 MCG/2ML IJ SOLN: 25.0000 ug | Freq: Once | INTRAMUSCULAR | Status: DC

## 2019-07-15 MED ORDER — SENNOSIDES-DOCUSATE SODIUM 8.6-50 MG PO TABS
2.0000 | ORAL_TABLET | ORAL | Status: DC
  Administered 2019-07-15: 2 via ORAL
  Filled 2019-07-15: qty 2

## 2019-07-15 MED ORDER — OXYTOCIN BOLUS FROM INFUSION
500.0000 mL | Freq: Once | INTRAVENOUS | Status: AC
  Administered 2019-07-15: 500 mL via INTRAVENOUS

## 2019-07-15 MED ORDER — LACTATED RINGERS IV SOLN
500.0000 mL | Freq: Once | INTRAVENOUS | Status: AC
  Administered 2019-07-15: 500 mL via INTRAVENOUS

## 2019-07-15 MED ORDER — LIDOCAINE HCL (PF) 1 % IJ SOLN: 30.0000 mL | INTRAMUSCULAR | Status: DC | PRN

## 2019-07-15 MED ORDER — COCONUT OIL OIL: 1.0000 "application " | TOPICAL_OIL | Status: DC | PRN

## 2019-07-15 MED ORDER — FENTANYL-BUPIVACAINE-NACL 0.5-0.125-0.9 MG/250ML-% EP SOLN
12.0000 mL/h | EPIDURAL | Status: DC | PRN
  Filled 2019-07-15: qty 250

## 2019-07-15 MED ORDER — BENZOCAINE-MENTHOL 20-0.5 % EX AERO
1.0000 "application " | INHALATION_SPRAY | CUTANEOUS | Status: DC | PRN
  Administered 2019-07-16: 1 via TOPICAL
  Filled 2019-07-15: qty 56

## 2019-07-15 MED ORDER — DIPHENHYDRAMINE HCL 25 MG PO CAPS: 25.0000 mg | ORAL_CAPSULE | Freq: Four times a day (QID) | ORAL | Status: DC | PRN

## 2019-07-15 MED ORDER — LACTATED RINGERS IV SOLN: 500.0000 mL | INTRAVENOUS | Status: DC | PRN

## 2019-07-15 NOTE — Anesthesia Procedure Notes (Signed)
Epidural Patient location during procedure: OB Start time: 07/15/2019 9:42 AM End time: 07/15/2019 9:52 AM  Staffing Anesthesiologist: Murvin Natal, MD Performed: anesthesiologist   Preanesthetic Checklist Completed: patient identified, site marked, pre-op evaluation, timeout performed, IV checked, risks and benefits discussed and monitors and equipment checked  Epidural Patient position: sitting Prep: DuraPrep Patient monitoring: heart rate, cardiac monitor, continuous pulse ox and blood pressure Approach: midline Location: L4-L5 Injection technique: LOR air  Needle:  Needle type: Tuohy  Needle gauge: 17 G Needle length: 9 cm Needle insertion depth: 6 cm Catheter type: closed end flexible Catheter size: 19 Gauge Catheter at skin depth: 11 cm Test dose: negative and Other  Assessment Events: blood not aspirated, injection not painful, no injection resistance and negative IV test  Additional Notes Informed consent obtained prior to proceeding including risk of failure, 1% risk of PDPH, risk of minor discomfort and bruising. Discussed alternatives to epidural analgesia and patient desires to proceed.  Timeout performed pre-procedure verifying patient name, procedure, and platelet count.  Patient tolerated procedure well. Reason for block:procedure for pain

## 2019-07-15 NOTE — Anesthesia Preprocedure Evaluation (Signed)
Anesthesia Evaluation  Patient identified by MRN, date of birth, ID band Patient awake    Reviewed: Allergy & Precautions, H&P , NPO status , Patient's Chart, lab work & pertinent test results  History of Anesthesia Complications Negative for: history of anesthetic complications  Airway Mallampati: II  TM Distance: >3 FB Neck ROM: full    Dental no notable dental hx. (+) Teeth Intact   Pulmonary asthma , former smoker,    Pulmonary exam normal breath sounds clear to auscultation       Cardiovascular negative cardio ROS Normal cardiovascular exam Rhythm:regular Rate:Normal     Neuro/Psych  Headaches, PSYCHIATRIC DISORDERS Anxiety Depression Bipolar Disorder ADHD (attention deficit hyperactivity disorder)Pseudotumor cerebri    GI/Hepatic negative GI ROS, Neg liver ROS,   Endo/Other  negative endocrine ROS  Renal/GU negative Renal ROS  negative genitourinary   Musculoskeletal   Abdominal (+) + obese,   Peds  Hematology  (+) Blood dyscrasia, anemia ,   Anesthesia Other Findings   Reproductive/Obstetrics (+) Pregnancy                             Anesthesia Physical Anesthesia Plan  ASA: II  Anesthesia Plan: Epidural   Post-op Pain Management:    Induction:   PONV Risk Score and Plan:   Airway Management Planned:   Additional Equipment:   Intra-op Plan:   Post-operative Plan:   Informed Consent: I have reviewed the patients History and Physical, chart, labs and discussed the procedure including the risks, benefits and alternatives for the proposed anesthesia with the patient or authorized representative who has indicated his/her understanding and acceptance.       Plan Discussed with:   Anesthesia Plan Comments:         Anesthesia Quick Evaluation

## 2019-07-15 NOTE — H&P (Addendum)
Mikayla [name] is a 30 y.[id number] female presenting in active labor. Pt had membranes stripped in office yesterday- was 4cm dilated. She reports had persistent contractions all day and was 5cm dilated on arrival to MAU.  Pt transferred into our practice at 28 weeks from [place]. She had prenatal care there. She has had a relatively benign prenatal course. Fetal pyelectasis noted otherwise nl findings. Pt has history or anxiety/depression/bipolar disease. GBS is negative.  OB History    Gravida  4   Para  3   Term  3   Preterm      AB      Living  3     SAB      TAB      Ectopic      Multiple  0   Live Births  3          Past Medical History:  Diagnosis Date  . ADHD (attention deficit hyperactivity disorder)   . Anxiety   . Asthma   . Bipolar disorder ([place])    no treatment needed  . Depression   . Encounter for supervision of normal pregnancy in multigravida in third trimester 01/12/2016  . Hx of varicella   . Infection   . Normal pregnancy, repeat 08/18/2013  . SVD (spontaneous vaginal delivery) 08/19/2013  . SVD (spontaneous vaginal delivery) 01/13/2016  . UTI (lower urinary tract infection)    Past Surgical History:  Procedure Laterality Date  . NO PAST SURGERIES    . WISDOM TOOTH EXTRACTION     Family History: family history includes Autism in her daughter; Leukemia in her daughter; Miscarriages / [place] in her mother. Social History:  reports that she quit smoking about 6 years ago. She has never used smokeless tobacco. She reports that she does not drink alcohol or use drugs.     Maternal Diabetes: No Genetic Screening: Declined Maternal Ultrasounds/Referrals: Fetal renal pyelectasis Fetal Ultrasounds or other Referrals:  None Maternal Substance Abuse:  No Significant Maternal Medications:  None Significant Maternal Lab Results:  Group B Strep negative Other Comments:  None  Review of Systems  Constitutional: Positive for malaise/fatigue.  Negative for chills, fever and weight loss.  Eyes: Negative for blurred vision.  Respiratory: Positive for shortness of breath.   Cardiovascular: Negative for chest pain.  Gastrointestinal: Positive for abdominal pain.  Musculoskeletal: Negative for myalgias.  Skin: Negative for itching and rash.  Neurological: Negative for dizziness and headaches.  Endo/Heme/Allergies: Does not bruise/bleed easily.  Psychiatric/Behavioral: Negative for depression, hallucinations, substance abuse and suicidal ideas. The patient is nervous/anxious.    Maternal Medical History:  Reason for admission: Contractions.   Contractions: Onset was yesterday.   Frequency: regular.   Perceived severity is strong.    Fetal activity: Perceived fetal activity is normal.   Last perceived fetal movement was within the past hour.    Prenatal complications: no prenatal complications Prenatal Complications - Diabetes: none.    Dilation: 5 Effacement (%): 70 Station: -3 Exam by:: Lesia Sago, RN  Blood pressure 111/63, pulse 69, temperature 97.9 F (36.6 C), temperature source Oral, resp. rate 17, height 5\' 4"  (1.626 m), weight 98.7 kg, unknown if currently breastfeeding. Maternal Exam:  Uterine Assessment: Contraction strength is moderate.  Contraction frequency is regular.   Abdomen: Patient reports generalized tenderness.  Estimated fetal weight is AGA.   Fetal presentation: vertex  Introitus: Normal vulva. Vulva is negative for condylomata and lesion.  Normal vagina.  Vagina is negative for condylomata.  Pelvis:  adequate for delivery.   Cervix: Cervix evaluated by digital exam.     Fetal Exam Fetal Monitor Review: Baseline rate: 125.  Variability: moderate (6-25 bpm).   Pattern: accelerations present and late decelerations.    Fetal State Assessment: Category I - tracings are normal.     Physical Exam  GI: There is generalized abdominal tenderness.  Genitourinary:    Vulva normal.     No vulval  condylomata or lesion noted.     Prenatal labs: ABO, Rh: --/--/A POS, A POS Performed at [facility], [address] [place], [place] [address]  [phone](09/19 0344) Antibody: NEG (09/19 0344) Rubella:   RPR:    HBsAg:    HIV:    GBS:     Assessment/Plan: 38JW [id number] here in active labor  Admitted Pitocin started for augmentation Pain control prn GBS and SARS covid negative AROM now Anticipate svd    [name] 07/15/2019, 8:45 AM

## 2019-07-15 NOTE — Anesthesia Postprocedure Evaluation (Signed)
Anesthesia Post Note  Patient: Mikayla [name]  Procedure(s) Performed: AN AD HOC LABOR EPIDURAL     Patient location during evaluation: Mother Baby Anesthesia Type: Epidural Level of consciousness: awake and alert, oriented and patient cooperative Pain management: pain level controlled Vital Signs Assessment: post-procedure vital signs reviewed and stable Respiratory status: spontaneous breathing Cardiovascular status: stable Postop Assessment: no headache, epidural receding, patient able to bend at knees, no signs of nausea or vomiting and able to ambulate Anesthetic complications: no Comments: Pain score 0.     Last Vitals:  Vitals:   07/15/19 1527 07/15/19 1651  BP: 116/64 114/67  Pulse: 64 63  Resp:  18  Temp: 36.9 C 36.7 C  SpO2: 100% 100%    Last Pain:  Vitals:   07/15/19 1543  TempSrc:   PainSc: 2    Pain Goal:                   [facility]

## 2019-07-15 NOTE — Progress Notes (Signed)
Patient ID: Mikayla [name], female   DOB: Feb 24, 1996, 23 y.o.   MRN: [id number] Pt received epidural about an hour ago She is now completely dilated  Will start pushing

## 2019-07-16 LAB — CBC
HCT: 31.9 % — ABNORMAL LOW (ref 36.0–46.0)
Hemoglobin: 10.3 g/dL — ABNORMAL LOW (ref 12.0–15.0)
MCH: 25.8 pg — ABNORMAL LOW (ref 26.0–34.0)
MCHC: 32.3 g/dL (ref 30.0–36.0)
MCV: 79.8 fL — ABNORMAL LOW (ref 80.0–100.0)
Platelets: 168 10*3/uL (ref 150–400)
RBC: 4 MIL/uL (ref 3.87–5.11)
RDW: 12.9 % (ref 11.5–15.5)
WBC: 10.2 10*3/uL (ref 4.0–10.5)
nRBC: 0 % (ref 0.0–0.2)

## 2019-07-16 MED ORDER — INFLUENZA VAC SPLIT QUAD 0.5 ML IM SUSY
0.5000 mL | PREFILLED_SYRINGE | INTRAMUSCULAR | Status: AC | PRN
  Administered 2019-07-16: 0.5 mL via INTRAMUSCULAR
  Filled 2019-07-16: qty 0.5

## 2019-07-16 MED ORDER — IBUPROFEN 600 MG PO TABS: 600.0000 mg | ORAL_TABLET | Freq: Four times a day (QID) | ORAL | 1 refills | Status: AC | PRN

## 2019-07-16 NOTE — Progress Notes (Signed)
Erroneous Encounter - Created in Error.  Nat Christen, LCSW

## 2019-07-16 NOTE — Lactation Note (Signed)
This note was copied from a baby's chart. Lactation Consultation Note Baby 68 hrs old. Mom is mostly formula feeding. She states that it hurts to bad and can't tolerate it. Mom has 20,6,25 yr old. She BF the first 2 children 2-3 months. Her 49 yr old she didn't BF d/t a tongue-tie. This baby has a upper lip tie, but has great mobility of tongue movement out past lips.  Mom has large nipples. Short shaft everts w/stimulation. Nipples has a hard rim around edges of nipple. Mom states she thinks the baby's mouth is to small and that is why it hurts so bad. Quentin notices the baby is tongue thrusting and clamping. W/gloved finger suck training attempted. Baby wouldn't suckle on gloved finger, cont. To clamp and tongue thrust. Mom is giving formula in bottle.  Discussed mom using DEBP if she would like to pump and bottle feed. Mom didn't act to excited and stated she would let us know if she wants to do that. Newborn behavior, STS, I&O, supply and demand discussed. Encouraged mom to call for assistance if needed. Lactation brochure given.  Patient Name: Mikayla [name] Date: 07/16/2019 Reason for consult: Initial assessment   Maternal Data Has patient been taught Hand Expression?: Yes Does the patient have breastfeeding experience prior to this delivery?: Yes  Feeding Feeding Type: Formula Nipple Type: Slow - flow  LATCH Score Latch: Repeated attempts needed to sustain latch, nipple held in mouth throughout feeding, stimulation needed to elicit sucking reflex.  Audible Swallowing: None  Type of Nipple: Everted at rest and after stimulation  Comfort (Breast/Nipple): Filling, red/small blisters or bruises, mild/mod discomfort  Hold (Positioning): Full assist, staff holds infant at breast  LATCH Score: 4  Interventions Interventions: Breast feeding basics reviewed;Adjust position;Assisted with latch;Support pillows;Skin to skin;Position options;Breast massage;Hand express;Breast  compression  Lactation Tools Discussed/Used WIC Program: Yes   Consult Status Consult Status: Follow-up Date: 07/17/19 Follow-up type: In-patient    Theodoro Kalata 07/16/2019, 5:17 AM

## 2019-07-16 NOTE — Discharge Instructions (Signed)
Call office with any concerns [phone] 

## 2019-07-16 NOTE — Discharge Summary (Signed)
OB Discharge Summary     Patient Name: Mikayla [name] C Campion DOB: 10-27-1995 MRN: [id number]  Date of admission: 07/14/2019 Delivering MD: Pryor OchoaBANGA, [facility]   Date of discharge: 07/16/2019  Admitting diagnosis: CTX 38wks Intrauterine pregnancy: 7992w2d     Secondary diagnosis:  Active Problems:   Indication for care in labor or delivery   Spontaneous vaginal delivery  Additional problems: none     Discharge diagnosis: Term Pregnancy Delivered                                                                                                Post partum procedures:none  Augmentation: AROM and Pitocin  Complications: None  Hospital course:  Onset of Labor With Vaginal Delivery     23 y.o. yo [id number] at 692w2d was admitted in Active Labor on 07/14/2019. Patient had an uncomplicated labor course as follows:  Membrane Rupture Time/Date: 9:28 AM ,07/15/2019   Intrapartum Procedures: Episiotomy: None [1]                                         Lacerations:  None [1]  Patient had a delivery of a Viable infant. 07/15/2019  Information for the patient's newborn:  Traci SermonDenman, Boy Lashuna [[id number]][[id number]]  Delivery Method: Vaginal, Spontaneous(Filed from Delivery Summary)     Pateint had an uncomplicated postpartum course.  She is ambulating, tolerating a regular diet, passing flatus, and urinating well. Patient is discharged home in stable condition on 07/16/19.   Physical exam  Vitals:   07/15/19 1651 07/15/19 2030 07/16/19 0011 07/16/19 0500  BP: 114/67 113/70 109/67 112/74  Pulse: 63 65 (!) 57 60  Resp: 18 18 18 18   Temp: 98.1 F (36.7 C) 97.7 F (36.5 C) 97.8 F (36.6 C) 98.5 F (36.9 C)  TempSrc:  Oral Oral Oral  SpO2: 100% 99% 99% 99%  Weight:      Height:       General: alert, cooperative and no distress Lochia: appropriate Uterine Fundus: firm Incision: N/A DVT Evaluation: No evidence of DVT seen on physical exam. No significant calf/ankle edema. Labs: Lab Results  Component  Value Date   WBC 10.2 07/16/2019   HGB 10.3 (L) 07/16/2019   HCT 31.9 (L) 07/16/2019   MCV 79.8 (L) 07/16/2019   PLT 168 07/16/2019   CMP Latest Ref Rng & Units 08/01/2014  Glucose 65 - 99 mg/dL 82  BUN 6 - 20 mg/dL 8  Creatinine 1.300.57 - 8.651.00 mg/dL 7.840.92  Sodium 696134 - 295144 mmol/L 139  Potassium 3.5 - 5.2 mmol/L 4.3  Chloride 97 - 108 mmol/L 101  CO2 18 - 29 mmol/L 23  Calcium 8.7 - 10.2 mg/dL 9.4  Total Protein 6.0 - 8.5 g/dL 7.1  Total Bilirubin 0.0 - 1.2 mg/dL 0.2  Alkaline Phos 39 - 117 IU/L 109  AST 0 - 40 IU/L 15  ALT 0 - 32 IU/L 7    Discharge instruction: per After Visit Summary and "Baby and Me Booklet".  After visit  meds:  Allergies as of 07/16/2019      Reactions   Penicillins Swelling   Has patient had a PCN reaction causing immediate rash, facial/tongue/throat swelling, SOB or lightheadedness with hypotension: Yes Has patient had a PCN reaction causing severe rash involving mucus membranes or skin necrosis: No Has patient had a PCN reaction that required hospitalization No Has patient had a PCN reaction occurring within the last 10 years: Yes If all of the above answers are "NO", then may proceed with Cephalosporin use.   Codeine    MD said she has given pt percocet without any problems      Medication List    STOP taking these medications   oxyCODONE-acetaminophen 5-325 MG tablet Commonly known as: PERCOCET/ROXICET     TAKE these medications   albuterol 108 (90 Base) MCG/ACT inhaler Commonly known as: VENTOLIN HFA Inhale into the lungs every 6 (six) hours as needed for wheezing or shortness of breath.   ibuprofen 600 MG tablet Commonly known as: ADVIL Take 1 tablet (600 mg total) by mouth every 6 (six) hours as needed for moderate pain or cramping. What changed:   medication strength  how much to take  when to take this  reasons to take this   prenatal multivitamin Tabs tablet Take 1 tablet by mouth daily at 12 noon.       Diet: routine  diet  Activity: Advance as tolerated. Pelvic rest for 6 weeks.   Outpatient follow XT:GGYIRSWNIOEV within two weeks and postpartum visit in 6 weeks Follow up Appt:No future appointments. Follow up Visit:No follow-ups on file.  Postpartum contraception: Not Discussed  Newborn Data: Live born female  Birth Weight: 7 lb 8.8 oz (3425 g) APGAR: 46, 9  Newborn Delivery   Birth date/time: 07/15/2019 12:28:00 Delivery type: Vaginal, Spontaneous      Baby Feeding: Bottle Disposition:home with mother   07/16/2019 Isaiah Serge, DO

## 2019-07-16 NOTE — Progress Notes (Signed)
Patient denies history of inpatient or outpatient psychiatric treatment. Patient denies symptoms of homicidal or suicidal ideation. Patient denies taking any type of psychotropic medications. Patient denies currently receiving therapeutic services for Bipolar Disorder, ADHD, Depression and Anxiety. Patient denies currently receiving psychiatry services for Bipolar Disorder, ADHD, Depression and Anxiety. Patient admits to having a great support system through family members. Patient currently resides with husband Keashia Haskins), daughter Anahid Eskelson), daughter Kolina Kube), father-in-law Melyna Huron) and mother-in-law Kebrina Friend). Patient admits to having everything she needs to properly care for the baby in the home. Patient admits to having car seat and crib for baby. Patient admits to receiving Medicaid, Food Stamps and WIC. Patient has transportation.  Patient has a college degree. Patient is currently unemployed, caring for 3 children. Patient has a Lexicographer for baby, Dr. Berline Lopes at [facility]. Patient denies experiencing PPD at present or with other two prior pregnancies. Patient given information about SIDS.  Nat Christen, BSW, MSW, LCSW  Licensed Holiday representative  [place]  Cell # [phone]  [email or web address]

## 2019-07-16 NOTE — Progress Notes (Signed)
Post Partum Day 1 Subjective: no complaints, up ad lib, voiding, tolerating PO, + flatus and lochia mild. She denies HA/CP/SOB and blurry vision. She is requesting early discharge today . Bonding well with baby- bottlefeeding Objective: Blood pressure 112/74, pulse 60, temperature 98.5 F (36.9 C), temperature source Oral, resp. rate 18, height 5\' 4"  (1.626 m), weight 98.7 kg, SpO2 99 %, unknown if currently breastfeeding.  Physical Exam:  General: alert, cooperative and no distress Lochia: appropriate Uterine Fundus: firm Incision: n/a DVT Evaluation: No evidence of DVT seen on physical exam. No significant calf/ankle edema.  Recent Labs    07/15/19 0344 07/16/19 0446  HGB 10.9* 10.3*  HCT 33.7* 31.9*    Assessment/Plan: Discharge home after 24hrs post delivery Decided on circumcision in office  Instructions reviewed   LOS: 1 day   [place] 07/16/2019, 10:24 AM

## 2019-07-16 NOTE — Clinical Social Work Maternal (Signed)
  CLINICAL SOCIAL WORK MATERNAL/CHILD NOTE  Patient Details  Name: Mikayla [name] MRN: [id number] Date of Birth: January 13, 1996  Date:  07/16/2019  Clinical Social Worker Initiating Note:  Nat Christen, [place] Date/Time: Initiated:  07/16/19/      Child's Name:  Mikayla [name]   Biological Parents:  Mother, Father(MOB - Mikayla [name]; FOB - Mikayla [name])   Need for Interpreter:  None   Reason for Referral:  Other (Comment), Behavioral Health Concerns(History of ADHD, Bipolar Disorder, Anxiety and Depression)   Address:  [address] [place] [address]    Phone number:  [phone] (home)     Additional phone number: None  Household Members/Support Persons (HM/SP):   Household Member/Support Person 2, Household Member/Support Person 3, Household Member/Support Person 4, Household Member/Support Person 1, Household Member/Support Person 5(MOB - Mikayla [name]; FOB - Mikayla [name]; Baby; Mikayla [name] (Daughter - age 34); Mikayla [name] (Daughter - age 16); Mikayla [name] (Grandmother); Mikayla [name](Grandfather))   HM/SP Name Relationship DOB or Age  HM/SP -1 Mikayla [name] Husband & FOB 80  HM/SP -2 Mikayla [name] Daughter 3  HM/SP -3 Mikayla [name] Daughter 5  HM/SP -[place] Father-in-law 40's  HM/SP -5 Rhae Hammock Mother-in-law 22's  HM/SP -6        HM/SP -7        HM/SP -8          Natural Supports (not living in the home):  Extended Family   Professional Supports: None   Employment: Unemployed   Type of Work:  None   Education:  Public librarian arranged:  None  Financial Resources:  [employer]   Other Resources:  Physicist, medical , [employer], Medicaid  Cultural/Religious Considerations Which May Impact Care:  None  Strengths:  Ability to meet basic needs , Compliance with medical plan , Home prepared for child , Understanding of illness, Pediatrician chosen   Psychotropic Medications:    None     Pediatrician:    [facility]olic[place] area  Pediatrician List:    [name](Mikayla [name])  [place]      Pediatrician Fax Number:    Risk Factors/Current Problems:  None   Cognitive State:  Alert , Able to Concentrate , Goal Oriented , Insightful    Mood/Affect:  Comfortable , Relaxed    CSW Assessment: Please see note.  CSW Plan/Description:  Psychosocial Support and Ongoing Assessment of Needs(List of Psychiatrists and Therapists in [facility].)    [place], Marta Lamas, [place] 07/16/2019, 4:26 PM

## 2019-07-17 ENCOUNTER — Encounter (HOSPITAL_COMMUNITY): Payer: Self-pay

## 2019-07-25 ENCOUNTER — Inpatient Hospital Stay (HOSPITAL_COMMUNITY)
Admission: AD | Admit: 2019-07-25 | Payer: Medicaid Other | Source: Home / Self Care | Admitting: Obstetrics and Gynecology

## 2019-07-25 ENCOUNTER — Inpatient Hospital Stay (HOSPITAL_COMMUNITY): Payer: Medicaid Other
